# Patient Record
Sex: Female | Born: 1975 | Race: Black or African American | Hispanic: No | Marital: Single | Smoking: Former smoker
Health system: Southern US, Community
[De-identification: ages and names within clinical notes are randomized; demographics above are authoritative.]

## PROBLEM LIST (undated history)

## (undated) DIAGNOSIS — D649 Anemia, unspecified: Secondary | ICD-10-CM

## (undated) DIAGNOSIS — G4730 Sleep apnea, unspecified: Secondary | ICD-10-CM

## (undated) DIAGNOSIS — M1990 Unspecified osteoarthritis, unspecified site: Secondary | ICD-10-CM

## (undated) DIAGNOSIS — I10 Essential (primary) hypertension: Secondary | ICD-10-CM

## (undated) HISTORY — PX: TUBAL LIGATION: SHX77

## (undated) HISTORY — PX: KNEE SURGERY: SHX244

## (undated) HISTORY — PX: TOTAL KNEE ARTHROPLASTY: SHX125

## (undated) HISTORY — PX: LAPAROSCOPIC GASTRIC SLEEVE RESECTION: SHX5895

## (undated) HISTORY — PX: CHOLECYSTECTOMY: SHX55

---

## 1997-11-17 ENCOUNTER — Inpatient Hospital Stay (HOSPITAL_COMMUNITY): Admission: AD | Admit: 1997-11-17 | Discharge: 1997-11-17 | Payer: Self-pay | Admitting: *Deleted

## 1999-10-13 ENCOUNTER — Encounter: Admission: RE | Admit: 1999-10-13 | Discharge: 2000-01-11 | Payer: Self-pay | Admitting: Orthopedic Surgery

## 2004-10-07 ENCOUNTER — Emergency Department (HOSPITAL_COMMUNITY): Admission: EM | Admit: 2004-10-07 | Discharge: 2004-10-08 | Payer: Self-pay | Admitting: Emergency Medicine

## 2005-01-01 ENCOUNTER — Emergency Department (HOSPITAL_COMMUNITY): Admission: EM | Admit: 2005-01-01 | Discharge: 2005-01-01 | Payer: Self-pay | Admitting: Emergency Medicine

## 2006-10-22 ENCOUNTER — Emergency Department (HOSPITAL_COMMUNITY): Admission: EM | Admit: 2006-10-22 | Discharge: 2006-10-22 | Payer: Self-pay | Admitting: Emergency Medicine

## 2006-10-27 ENCOUNTER — Emergency Department (HOSPITAL_COMMUNITY): Admission: EM | Admit: 2006-10-27 | Discharge: 2006-10-27 | Payer: Self-pay | Admitting: Family Medicine

## 2009-05-30 ENCOUNTER — Emergency Department (HOSPITAL_COMMUNITY): Admission: EM | Admit: 2009-05-30 | Discharge: 2009-05-30 | Payer: Self-pay | Admitting: Family Medicine

## 2009-12-22 ENCOUNTER — Inpatient Hospital Stay (HOSPITAL_COMMUNITY): Admission: RE | Admit: 2009-12-22 | Discharge: 2009-12-25 | Payer: Self-pay | Admitting: Orthopedic Surgery

## 2010-01-20 ENCOUNTER — Encounter: Admission: RE | Admit: 2010-01-20 | Discharge: 2010-01-27 | Payer: Self-pay | Admitting: Orthopedic Surgery

## 2010-02-02 ENCOUNTER — Inpatient Hospital Stay (HOSPITAL_COMMUNITY): Admission: RE | Admit: 2010-02-02 | Discharge: 2010-02-05 | Payer: Self-pay | Admitting: Orthopedic Surgery

## 2010-02-24 ENCOUNTER — Encounter: Admission: RE | Admit: 2010-02-24 | Discharge: 2010-03-18 | Payer: Self-pay | Admitting: Orthopedic Surgery

## 2010-12-28 LAB — BASIC METABOLIC PANEL
BUN: 4 mg/dL — ABNORMAL LOW (ref 6–23)
CO2: 26 mEq/L (ref 19–32)
CO2: 29 mEq/L (ref 19–32)
Calcium: 8.1 mg/dL — ABNORMAL LOW (ref 8.4–10.5)
Chloride: 105 mEq/L (ref 96–112)
Chloride: 105 mEq/L (ref 96–112)
Creatinine, Ser: 0.76 mg/dL (ref 0.4–1.2)
GFR calc Af Amer: >60 mL/min (ref >60)
GFR calc Af Amer: >60 mL/min (ref >60)
GFR calc non Af Amer: >60 mL/min (ref >60)
Sodium: 135 mEq/L (ref 135–145)
Sodium: 138 mEq/L (ref 135–145)

## 2010-12-28 LAB — CBC
HCT: 27.6 % — ABNORMAL LOW (ref 36.0–46.0)
HCT: 32.2 % — ABNORMAL LOW (ref 36.0–46.0)
Hemoglobin: 7.7 g/dL — ABNORMAL LOW (ref 12.0–15.0)
Hemoglobin: 8.9 g/dL — ABNORMAL LOW (ref 12.0–15.0)
MCHC: 31.6 g/dL (ref 30.0–36.0)
MCHC: 31.9 g/dL (ref 30.0–36.0)
MCHC: 32.1 g/dL (ref 30.0–36.0)
MCV: 75.6 fL — ABNORMAL LOW (ref 78.0–100.0)
MCV: 76.3 fL — ABNORMAL LOW (ref 78.0–100.0)
Platelets: 271 10*3/uL (ref 150–400)
RBC: 3.67 MIL/uL — ABNORMAL LOW (ref 3.87–5.11)
RDW: 20.7 % — ABNORMAL HIGH (ref 11.5–15.5)
WBC: 12.2 10*3/uL — ABNORMAL HIGH (ref 4.0–10.5)
WBC: 6.1 10*3/uL (ref 4.0–10.5)

## 2010-12-28 LAB — DIFFERENTIAL
Basophils Absolute: 0 10*3/uL (ref 0.0–0.1)
Eosinophils Absolute: 0.1 10*3/uL (ref 0.0–0.7)
Eosinophils Relative: 2 % (ref 0–5)
Lymphs Abs: 2.5 10*3/uL (ref 0.7–4.0)
Neutro Abs: 3.1 10*3/uL (ref 1.7–7.7)

## 2010-12-28 LAB — URINALYSIS, ROUTINE W REFLEX MICROSCOPIC
Hgb urine dipstick: NEGATIVE
Ketones, ur: NEGATIVE mg/dL
Nitrite: NEGATIVE
Protein, ur: NEGATIVE mg/dL
Urobilinogen, UA: 0.2 mg/dL (ref 0.0–1.0)
pH: 5.5 (ref 5.0–8.0)

## 2010-12-28 LAB — APTT: aPTT: 31 seconds (ref 24–37)

## 2010-12-28 LAB — PROTIME-INR
INR: 1.13 (ref 0.00–1.49)
Prothrombin Time: 14.4 seconds (ref 11.6–15.2)

## 2010-12-28 LAB — PREGNANCY, URINE: Preg Test, Ur: NEGATIVE

## 2010-12-28 LAB — TYPE AND SCREEN: Antibody Screen: NEGATIVE

## 2011-01-03 LAB — CBC
HCT: 21.8 % — ABNORMAL LOW (ref 36.0–46.0)
HCT: 26.4 % — ABNORMAL LOW (ref 36.0–46.0)
Hemoglobin: 6.9 g/dL — CL (ref 12.0–15.0)
Hemoglobin: 8.1 g/dL — ABNORMAL LOW (ref 12.0–15.0)
Hemoglobin: 9.6 g/dL — ABNORMAL LOW (ref 12.0–15.0)
MCHC: 30.8 g/dL (ref 30.0–36.0)
MCHC: 31.8 g/dL (ref 30.0–36.0)
MCV: 72.8 fL — ABNORMAL LOW (ref 78.0–100.0)
Platelets: 301 10*3/uL (ref 150–400)
RBC: 3.62 MIL/uL — ABNORMAL LOW (ref 3.87–5.11)
RBC: 4.29 MIL/uL (ref 3.87–5.11)
RDW: 17 % — ABNORMAL HIGH (ref 11.5–15.5)
RDW: 17.4 % — ABNORMAL HIGH (ref 11.5–15.5)
RDW: 17.5 % — ABNORMAL HIGH (ref 11.5–15.5)

## 2011-01-03 LAB — DIFFERENTIAL
Basophils Absolute: 0.1 10*3/uL (ref 0.0–0.1)
Lymphocytes Relative: 32 % (ref 12–46)
Lymphs Abs: 2.4 10*3/uL (ref 0.7–4.0)
Monocytes Absolute: 0.5 10*3/uL (ref 0.1–1.0)
Neutro Abs: 4 10*3/uL (ref 1.7–7.7)
Neutrophils Relative %: 54 % (ref 43–77)

## 2011-01-03 LAB — URINALYSIS, ROUTINE W REFLEX MICROSCOPIC
Bilirubin Urine: NEGATIVE
Ketones, ur: NEGATIVE mg/dL
Protein, ur: NEGATIVE mg/dL
Specific Gravity, Urine: 1.029 (ref 1.005–1.030)
Urobilinogen, UA: 0.2 mg/dL (ref 0.0–1.0)

## 2011-01-03 LAB — TYPE AND SCREEN
ABO/RH(D): AB POS
Antibody Screen: NEGATIVE

## 2011-01-03 LAB — BASIC METABOLIC PANEL
BUN: 14 mg/dL (ref 6–23)
BUN: 9 mg/dL (ref 6–23)
CO2: 28 mEq/L (ref 19–32)
CO2: 28 mEq/L (ref 19–32)
CO2: 28 mEq/L (ref 19–32)
Calcium: 8.7 mg/dL (ref 8.4–10.5)
Chloride: 104 mEq/L (ref 96–112)
Chloride: 108 mEq/L (ref 96–112)
Creatinine, Ser: 0.73 mg/dL (ref 0.4–1.2)
GFR calc Af Amer: >60 mL/min (ref >60)
Glucose, Bld: 98 mg/dL (ref 70–99)
Potassium: 3.3 mEq/L — ABNORMAL LOW (ref 3.5–5.1)
Potassium: 4 mEq/L (ref 3.5–5.1)
Potassium: 4.3 mEq/L (ref 3.5–5.1)
Sodium: 135 mEq/L (ref 135–145)
Sodium: 136 mEq/L (ref 135–145)
Sodium: 140 mEq/L (ref 135–145)

## 2011-01-03 LAB — URINE MICROSCOPIC-ADD ON

## 2011-01-03 LAB — ABO/RH: ABO/RH(D): AB POS

## 2011-01-03 LAB — APTT: aPTT: 33 seconds (ref 24–37)

## 2011-07-18 ENCOUNTER — Inpatient Hospital Stay (INDEPENDENT_AMBULATORY_CARE_PROVIDER_SITE_OTHER)
Admission: RE | Admit: 2011-07-18 | Discharge: 2011-07-18 | Disposition: A | Discharge status: Home / Self Care | Payer: Medicaid Other | Source: Ambulatory Visit | Attending: Family Medicine | Admitting: Family Medicine

## 2011-07-18 DIAGNOSIS — J069 Acute upper respiratory infection, unspecified: Secondary | ICD-10-CM

## 2011-07-18 LAB — POCT I-STAT, CHEM 8
Calcium, Ion: 1.16 mmol/L (ref 1.12–1.32)
Creatinine, Ser: 0.7 mg/dL (ref 0.50–1.10)
Glucose, Bld: 96 mg/dL (ref 70–99)
HCT: 30 % — ABNORMAL LOW (ref 36.0–46.0)
Hemoglobin: 10.2 g/dL — ABNORMAL LOW (ref 12.0–15.0)
Potassium: 3.6 mEq/L (ref 3.5–5.1)

## 2011-07-18 LAB — POCT URINALYSIS DIP (DEVICE)
Glucose, UA: NEGATIVE mg/dL
Hgb urine dipstick: NEGATIVE
Nitrite: NEGATIVE
Specific Gravity, Urine: >=1.03 (ref 1.005–1.030)
Urobilinogen, UA: 1 mg/dL (ref 0.0–1.0)
pH: 5.5 (ref 5.0–8.0)

## 2012-12-04 ENCOUNTER — Encounter (HOSPITAL_COMMUNITY): Payer: Self-pay | Admitting: Cardiology

## 2012-12-04 ENCOUNTER — Emergency Department (HOSPITAL_COMMUNITY)
Admission: EM | Admit: 2012-12-04 | Discharge: 2012-12-04 | Disposition: A | Discharge status: Home / Self Care | Payer: Self-pay | Attending: Emergency Medicine | Admitting: Emergency Medicine

## 2012-12-04 ENCOUNTER — Emergency Department (HOSPITAL_COMMUNITY): Payer: Self-pay

## 2012-12-04 DIAGNOSIS — R52 Pain, unspecified: Secondary | ICD-10-CM | POA: Insufficient documentation

## 2012-12-04 DIAGNOSIS — R197 Diarrhea, unspecified: Secondary | ICD-10-CM | POA: Insufficient documentation

## 2012-12-04 DIAGNOSIS — R0602 Shortness of breath: Secondary | ICD-10-CM | POA: Insufficient documentation

## 2012-12-04 DIAGNOSIS — R6883 Chills (without fever): Secondary | ICD-10-CM | POA: Insufficient documentation

## 2012-12-04 DIAGNOSIS — R42 Dizziness and giddiness: Secondary | ICD-10-CM | POA: Insufficient documentation

## 2012-12-04 DIAGNOSIS — F17200 Nicotine dependence, unspecified, uncomplicated: Secondary | ICD-10-CM | POA: Insufficient documentation

## 2012-12-04 DIAGNOSIS — R110 Nausea: Secondary | ICD-10-CM | POA: Insufficient documentation

## 2012-12-04 MED ORDER — ALBUTEROL SULFATE HFA 108 (90 BASE) MCG/ACT IN AERS
2.0000 | INHALATION_SPRAY | RESPIRATORY_TRACT | Status: DC | PRN
  Administered 2012-12-04: 2 via RESPIRATORY_TRACT
  Filled 2012-12-04: qty 6.7

## 2012-12-04 NOTE — ED Provider Notes (Signed)
History    This chart was scribed for non-physician practitioner working with Ethelda Chick, MD by Gerlean Ren, ED Scribe. This patient was seen in room TR11C/TR11C and the patient's care was started at 5:21 PM.    CSN: [id number]  Arrival date & time 12/04/12  1628   First MD Initiated Contact with Patient 12/04/12 1647      Chief Complaint  Patient presents with  . Nasal Congestion  . Generalized Body Aches  . Nausea     The history is provided by the patient. No language interpreter was used.  Latasha Snyder is a 37 y.o. female who presents to the Emergency Department complaining of 2 days of waxing-and-waning chills with associated nasal congestion, mild otalgia, nausea without emesis, mild dry cough occasionally productive of sputum, mild occasional light-headedness brought on by normal daily exertion, and mild dyspnea brought on by normal daily exertion.  Pt also reports approximately 15 episodes of non-bloody diarrhea over the past 2 days that has been improving today.  Pt denies sore throat, sneezing, fever, chest pain, abdominal pain.  Pt reports sick contacts with her son and brother, both of whom had diarrhea and emesis 2-3 days ago.  No antibiotics taken recently.  Pt denies recent long travels, recent surgeries.  No h/o PE.  No h/o asthma.  Pt has used Nyquil/Dayquil with no improvements.  Pt reports prior diagnosed flu and states current symptoms do not feel as severe as that incidence.  Pt is a current someday smoker.    History reviewed. No pertinent past medical history.  Past Surgical History  Procedure Laterality Date  . Knee surgery      History reviewed. No pertinent family history.  History  Substance Use Topics  . Smoking status: Current Some Day Smoker  . Smokeless tobacco: Not on file  . Alcohol Use: Yes    No OB history provided.   Review of Systems  Constitutional: Positive for chills. Negative for fever.  HENT: Positive for congestion. Negative for  sore throat and sneezing.   Respiratory: Positive for cough and shortness of breath.   Cardiovascular: Negative for chest pain.  Gastrointestinal: Positive for nausea and diarrhea. Negative for vomiting and abdominal pain.  Neurological: Positive for light-headedness.  All other systems reviewed and are negative.    Allergies  Review of patient's allergies indicates no known allergies.  Home Medications  No current outpatient prescriptions on file.  BP 149/100  Pulse 96  Temp(Src) 98.9 F (37.2 C) (Oral)  Resp 20  SpO2 100%  LMP 11/13/2012  Physical Exam  Nursing note and vitals reviewed. Constitutional: She is oriented to person, place, and time. She appears well-developed and well-nourished. No distress.  HENT:  Head: Normocephalic and atraumatic.  Eyes: EOM are normal.  Neck: Neck supple. No tracheal deviation present.  Cardiovascular: Normal rate, regular rhythm and normal heart sounds.   Pulmonary/Chest: Effort normal and breath sounds normal. No respiratory distress. She has no wheezes. She has no rales.  Abdominal: There is no tenderness.  Musculoskeletal: Normal range of motion.  Neurological: She is alert and oriented to person, place, and time.  Skin: Skin is warm and dry.  Psychiatric: She has a normal mood and affect. Her behavior is normal.    ED Course  Procedures (including critical care time) DIAGNOSTIC STUDIES: Oxygen Saturation is 100% on room air, normal by my interpretation.    COORDINATION OF CARE: 5:29 PM- Informed pt that due to normal exam and negative  chest XR symptoms appear viral in nature and that there are no helpful medications that I can prescribe.  Informed pt that although diarrhea and light-headedness is present I have low suspicion that she is hypotensive.  Advised staying hydrated.  Pt understands and agrees.     Dg Chest 2 View  12/04/2012  *RADIOLOGY REPORT*  Clinical Data: Congestion, cough and chills.  CHEST - 2 VIEW   Comparison: 12/16/2009  Findings: No evidence of focal infiltrate, edema or pleural fluid. The heart size is at the upper limits of normal.  A pronounced rightward convex scoliosis of the thoracic spine is again identified.  IMPRESSION: No active disease.   Original Report Authenticated By: Irish Lack, M.D.      1. Flu-like symptoms       MDM    I personally performed the services described in this documentation, which was scribed in my presence. The recorded information has been reviewed and is accurate.         Fayrene Helper, PA-C 12/05/12 1603

## 2012-12-04 NOTE — ED Notes (Signed)
Patient transported to X-ray 

## 2012-12-04 NOTE — ED Notes (Signed)
Pt reports for the past couple of days she has been having body aches and cough with congestion. Denies any fevers, but reports she has felt nauseated. No distress noted. Denies any abd pain.

## 2012-12-07 NOTE — ED Provider Notes (Signed)
Medical screening examination/treatment/procedure(s) were performed by non-physician practitioner and as supervising physician I was immediately available for consultation/collaboration.  Ethelda Chick, MD 12/07/12 [phone]

## 2013-12-03 ENCOUNTER — Emergency Department (HOSPITAL_COMMUNITY)
Admission: EM | Admit: 2013-12-03 | Discharge: 2013-12-03 | Disposition: A | Discharge status: Home / Self Care | Payer: Medicaid Other | Attending: Emergency Medicine | Admitting: Emergency Medicine

## 2013-12-03 ENCOUNTER — Emergency Department (HOSPITAL_COMMUNITY): Payer: Medicaid Other

## 2013-12-03 ENCOUNTER — Encounter (HOSPITAL_COMMUNITY): Payer: Self-pay | Admitting: Emergency Medicine

## 2013-12-03 DIAGNOSIS — S4980XA Other specified injuries of shoulder and upper arm, unspecified arm, initial encounter: Secondary | ICD-10-CM | POA: Insufficient documentation

## 2013-12-03 DIAGNOSIS — R059 Cough, unspecified: Secondary | ICD-10-CM | POA: Insufficient documentation

## 2013-12-03 DIAGNOSIS — Y9389 Activity, other specified: Secondary | ICD-10-CM | POA: Insufficient documentation

## 2013-12-03 DIAGNOSIS — R05 Cough: Secondary | ICD-10-CM | POA: Insufficient documentation

## 2013-12-03 DIAGNOSIS — S199XXA Unspecified injury of neck, initial encounter: Secondary | ICD-10-CM

## 2013-12-03 DIAGNOSIS — R6883 Chills (without fever): Secondary | ICD-10-CM | POA: Insufficient documentation

## 2013-12-03 DIAGNOSIS — S0993XA Unspecified injury of face, initial encounter: Secondary | ICD-10-CM | POA: Insufficient documentation

## 2013-12-03 DIAGNOSIS — J069 Acute upper respiratory infection, unspecified: Secondary | ICD-10-CM

## 2013-12-03 DIAGNOSIS — J029 Acute pharyngitis, unspecified: Secondary | ICD-10-CM | POA: Insufficient documentation

## 2013-12-03 DIAGNOSIS — S0990XA Unspecified injury of head, initial encounter: Secondary | ICD-10-CM | POA: Insufficient documentation

## 2013-12-03 DIAGNOSIS — Y92410 Unspecified street and highway as the place of occurrence of the external cause: Secondary | ICD-10-CM | POA: Insufficient documentation

## 2013-12-03 DIAGNOSIS — J3489 Other specified disorders of nose and nasal sinuses: Secondary | ICD-10-CM | POA: Insufficient documentation

## 2013-12-03 DIAGNOSIS — F17200 Nicotine dependence, unspecified, uncomplicated: Secondary | ICD-10-CM | POA: Insufficient documentation

## 2013-12-03 DIAGNOSIS — Z79899 Other long term (current) drug therapy: Secondary | ICD-10-CM | POA: Insufficient documentation

## 2013-12-03 DIAGNOSIS — S46909A Unspecified injury of unspecified muscle, fascia and tendon at shoulder and upper arm level, unspecified arm, initial encounter: Secondary | ICD-10-CM | POA: Insufficient documentation

## 2013-12-03 MED ORDER — IBUPROFEN 400 MG PO TABS
800.0000 mg | ORAL_TABLET | Freq: Once | ORAL | Status: AC
  Administered 2013-12-03: 800 mg via ORAL
  Filled 2013-12-03: qty 2
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
## 2013-12-04 NOTE — ED Provider Notes (Signed)
Medical screening examination/treatment/procedure(s) were performed by non-physician practitioner and as supervising physician I was immediately available for consultation/collaboration.  EKG Interpretation   None         Richardean Canalavid H Lyzette Reinhardt, MD 12/04/13 [phone]

## 2014-10-10 ENCOUNTER — Encounter (HOSPITAL_COMMUNITY): Payer: Self-pay | Admitting: Emergency Medicine

## 2014-10-10 ENCOUNTER — Emergency Department (HOSPITAL_COMMUNITY)
Admission: EM | Admit: 2014-10-10 | Discharge: 2014-10-10 | Disposition: A | Discharge status: Home / Self Care | Payer: No Typology Code available for payment source | Attending: Emergency Medicine | Admitting: Emergency Medicine

## 2014-10-10 DIAGNOSIS — R000 Tachycardia, unspecified: Secondary | ICD-10-CM | POA: Insufficient documentation

## 2014-10-10 DIAGNOSIS — R1110 Vomiting, unspecified: Secondary | ICD-10-CM | POA: Insufficient documentation

## 2014-10-10 DIAGNOSIS — Z720 Tobacco use: Secondary | ICD-10-CM | POA: Insufficient documentation

## 2014-10-10 DIAGNOSIS — J40 Bronchitis, not specified as acute or chronic: Secondary | ICD-10-CM | POA: Insufficient documentation

## 2014-10-10 HISTORY — DX: Morbid (severe) obesity due to excess calories: E66.01

## 2014-10-10 MED ORDER — IPRATROPIUM BROMIDE 0.02 % IN SOLN
0.5000 mg | Freq: Once | RESPIRATORY_TRACT | Status: AC
  Administered 2014-10-10: 0.5 mg via RESPIRATORY_TRACT
  Filled 2014-10-10: qty 2.5

## 2014-10-10 MED ORDER — ALBUTEROL SULFATE (2.5 MG/3ML) 0.083% IN NEBU
5.0000 mg | INHALATION_SOLUTION | Freq: Once | RESPIRATORY_TRACT | Status: AC
  Administered 2014-10-10: 5 mg via RESPIRATORY_TRACT
  Filled 2014-10-10: qty 6

## 2014-10-10 MED ORDER — ALBUTEROL SULFATE HFA 108 (90 BASE) MCG/ACT IN AERS
2.0000 | INHALATION_SPRAY | RESPIRATORY_TRACT | Status: DC | PRN
  Administered 2014-10-10: 2 via RESPIRATORY_TRACT
  Filled 2014-10-10: qty 6.7

## 2014-10-10 MED ORDER — GUAIFENESIN 100 MG/5ML PO LIQD: 100.0000 mg | ORAL | Status: DC | PRN

## 2014-10-10 NOTE — ED Provider Notes (Signed)
CSN: [id number]     Arrival date & time 10/10/14  1231 History  This chart was scribed for Fayrene Helper, PA-C, working with Audree Camel, MD by Elon Spanner, ED Scribe. This patient was seen in room WTR8/WTR8 and the patient's care was started at 1:45 PM.    Chief Complaint  Patient presents with  . Cough  . Nasal Congestion   The history is provided by the patient. No language interpreter was used.   HPI Comments: Latasha Snyder is a 39 y.o. female who presents to the Emergency Department complaining of a cough onset 3 days ago.  She reports that she has not been feeling well for 2 weeks but observed worsening 3 days ago with onset of associated chest/head congestion, SOB, headache, chills, rhinorrhea, sneezing, coughing, sore throat, and posttussive emesis.  Patient reports using some OTC medication without relief including theraflu.  Patient denies history of asthma. Patient denies history of DVT/PE.  Patient denies recent prolonged immobilization.  Patient denies use ofanti-coagulants.  Patient denies history of cancer. Patient denies abominal cramping, nausea, diarrhea    Past Medical History  Diagnosis Date  . Morbid obesity    Past Surgical History  Procedure Laterality Date  . Knee surgery     No family history on file. History  Substance Use Topics  . Smoking status: Current Some Day Smoker  . Smokeless tobacco: Not on file  . Alcohol Use: Yes   OB History    No data available     Review of Systems  Constitutional: Positive for chills.  HENT: Positive for congestion, rhinorrhea and sore throat.   Respiratory: Positive for cough.   Gastrointestinal: Positive for vomiting. Negative for nausea, abdominal pain and diarrhea.  Neurological: Positive for headaches.      Allergies  Review of patient's allergies indicates no known allergies.  Home Medications   Prior to Admission medications   Medication Sig Start Date End Date Taking? Authorizing Provider   Chlorphen-Pseudoephed-APAP (THERAFLU FLU/COLD PO) Take 10 mLs by mouth daily as needed. For cold symptoms    Historical Provider, MD   BP 153/79 mmHg  Pulse 109  Temp(Src) 98 F (36.7 C) (Oral)  Resp 20  SpO2 98% Physical Exam  Constitutional: She is oriented to person, place, and time. She appears well-developed and well-nourished. No distress.  HENT:  Head: Normocephalic and atraumatic.  Ears normal.  No tonsillar enlargement, no exudate, no trismus.  No sinus tenderness.    Eyes: Conjunctivae and EOM are normal.  Neck: Neck supple. No tracheal deviation present.  Cardiovascular: Normal rate.   Tachycardia without murmurs rubs or gallops.  Pulmonary/Chest: Effort normal. No respiratory distress.  Mild expiratory wheezes heard throughout.   Musculoskeletal: Normal range of motion.  Neurological: She is alert and oriented to person, place, and time.  Skin: Skin is warm and dry.  Psychiatric: She has a normal mood and affect. Her behavior is normal.  Nursing note and vitals reviewed.   ED Course  Procedures (including critical care time)  DIAGNOSTIC STUDIES: Oxygen Saturation is 98% on RA, normal by my interpretation.    COORDINATION OF CARE:  1:49 PM Pt here with URI sxs.  She does not have hx of asthma/copd however she is actively wheezing.  She is also tachycardic but does not have any significant risk factors for PE.  Will order breathing treatment.  Patient acknowledges and agrees with plan.    Labs Review Labs Reviewed - No data to display  Imaging Review No results found.   EKG Interpretation None      MDM   Final diagnoses:  Bronchitis    BP 153/79 mmHg  Pulse 109  Temp(Src) 98 F (36.7 C) (Oral)  Resp 20  SpO2 98%   I personally performed the services described in this documentation, which was scribed in my presence. The recorded information has been reviewed and is accurate.       Fayrene Helper, PA-C 10/10/14 1507  Audree Camel,  MD 10/10/14 [phone]

## 2014-10-10 NOTE — ED Notes (Signed)
Pt c/o cough, nasal congestion, and subjective fever (afebrile here) x 3 wks.

## 2014-10-10 NOTE — ED Notes (Signed)
Pt ambulated; tolerated procedure well; pt states she is jittery

## 2014-10-10 NOTE — Discharge Instructions (Signed)

## 2015-08-05 ENCOUNTER — Emergency Department (HOSPITAL_COMMUNITY)
Admission: EM | Admit: 2015-08-05 | Discharge: 2015-08-05 | Disposition: A | Discharge status: Home / Self Care | Payer: Medicaid Other | Attending: Emergency Medicine | Admitting: Emergency Medicine

## 2015-08-05 ENCOUNTER — Encounter (HOSPITAL_COMMUNITY): Payer: Self-pay

## 2015-08-05 DIAGNOSIS — R631 Polydipsia: Secondary | ICD-10-CM | POA: Diagnosis not present

## 2015-08-05 DIAGNOSIS — R358 Other polyuria: Secondary | ICD-10-CM | POA: Insufficient documentation

## 2015-08-05 DIAGNOSIS — N3000 Acute cystitis without hematuria: Secondary | ICD-10-CM | POA: Insufficient documentation

## 2015-08-05 DIAGNOSIS — Z3202 Encounter for pregnancy test, result negative: Secondary | ICD-10-CM | POA: Diagnosis not present

## 2015-08-05 DIAGNOSIS — R51 Headache: Secondary | ICD-10-CM | POA: Insufficient documentation

## 2015-08-05 DIAGNOSIS — R05 Cough: Secondary | ICD-10-CM | POA: Insufficient documentation

## 2015-08-05 DIAGNOSIS — Z87891 Personal history of nicotine dependence: Secondary | ICD-10-CM | POA: Diagnosis not present

## 2015-08-05 DIAGNOSIS — R6883 Chills (without fever): Secondary | ICD-10-CM | POA: Diagnosis not present

## 2015-08-05 DIAGNOSIS — R1030 Lower abdominal pain, unspecified: Secondary | ICD-10-CM | POA: Diagnosis present

## 2015-08-05 LAB — URINALYSIS, ROUTINE W REFLEX MICROSCOPIC
BILIRUBIN URINE: NEGATIVE
Glucose, UA: NEGATIVE mg/dL
Ketones, ur: NEGATIVE mg/dL
NITRITE: NEGATIVE
PROTEIN: 100 mg/dL — AB
SPECIFIC GRAVITY, URINE: 1.012 (ref 1.005–1.030)
UROBILINOGEN UA: 0.2 mg/dL (ref 0.0–1.0)
pH: 5.5 (ref 5.0–8.0)

## 2015-08-05 LAB — URINE MICROSCOPIC-ADD ON

## 2015-08-05 LAB — PREGNANCY, URINE: PREG TEST UR: NEGATIVE

## 2015-08-05 LAB — CBG MONITORING, ED: GLUCOSE-CAPILLARY: 93 mg/dL (ref 65–99)

## 2015-08-05 MED ORDER — CEPHALEXIN 500 MG PO CAPS: 500.0000 mg | ORAL_CAPSULE | Freq: Four times a day (QID) | ORAL | Status: DC

## 2015-08-05 NOTE — ED Notes (Signed)
Pt c/o RLQ pain, odorous urine, chills, generalized body aches, and headache x 3 days.  Pain score 7/10.  Pt reports taking OTC medications w/ some relief.  Denies n/v/d.  Denies vaginal discharge.

## 2015-08-05 NOTE — Discharge Instructions (Signed)
It is important to find a primary care doctor! I have attached resources to help you find a doctor in the area. I would like you to follow up with this doctor to ensure you have cleared the infection.  Please take antibiotic as directed.  Return if fever, increase in pain, or any additional concern.   Emergency Department Resource Guide 1) Find a Doctor and Pay Out of Pocket Although you won't have to find out who is covered by your insurance plan, it is a good idea to ask around and get recommendations. You will then need to call the office and see if the doctor you have chosen will accept you as a new patient and what types of options they offer for patients who are self-pay. Some doctors offer discounts or will set up payment plans for their patients who do not have insurance, but you will need to ask so you aren't surprised when you get to your appointment.  2) Contact Your Local Health Department Not all health departments have doctors that can see patients for sick visits, but many do, so it is worth a call to see if yours does. If you don't know where your local health department is, you can check in your phone book. The CDC also has a tool to help you locate your state's health department, and many state websites also have listings of all of their local health departments.  3) Find a Walk-in Clinic If your illness is not likely to be very severe or complicated, you may want to try a walk in clinic. These are popping up all over the country in pharmacies, drugstores, and shopping centers. They're usually staffed by nurse practitioners or physician assistants that have been trained to treat common illnesses and complaints. They're usually fairly quick and inexpensive. However, if you have serious medical issues or chronic medical problems, these are probably not your best option.  No Primary Care Doctor: - Call Health Connect at  [phone] - they can help you locate a primary care doctor that   accepts your insurance, provides certain services, etc. - Physician Referral Service- [phone]  Chronic Pain Problems: Organization         Address  Phone   Notes  [facility]  [phone] Patients need to be referred by their primary care doctor.   Medication Assistance: Organization         Address  Phone   Notes  [facility] Medication [facility] [address] [place] [place], [place] [address] [phone] --Must be a resident of [facility] -- Must have NO insurance coverage whatsoever (no Medicaid/ Medicare, etc.) -- The pt. MUST have a primary care doctor that directs their care regularly and follows them in the community   MedAssist  [phone]   [employer]  [phone]    Agencies that provide inexpensive medical care: Organization         Address  Phone   Notes  [facility]  [phone]   [facility]    [phone]   [facility] [address] [place], [place] [address] [phone]   [facility] [address] [place] [place] [phone]   Planned Parenthood    [phone]   [facility]    [phone]   [facility]  [address], [place] Phone:  [phone], Fax:  [phone])  [phone] Hours of Operation:  9 am - 6 pm, M-F.  Also accepts Medicaid/Medicare and self-pay.  [facility]  [address], [place] Phone: [phone], Fax: [phone]. Hours of Operation:  8:30 am - 5:30 pm, M-F.  Also accepts Medicaid and self-pay.  [facility] [place] [address], [place] Phone: [phone]   [facility] [address] Natasha Bence [place], [place] [phone], Ext. [phone] Mondays & Thursdays: 7-9 AM.  First 15 patients are seen on a first come, first serve basis.    Medicaid-accepting [facility] Providers:  Organization          Address  Phone   Notes  [facility] [address], [place] [phone] Also accepts self-pay patients.  [facility] [address] Laurell Josephs [place], [place]  [phone]   [facility] [address], [place] [phone]   [facility] [address] [place] [phone]   Renaye Rakers [address], [place]   [phone] Only accepts [place] Access [place] patients after they have their name applied to their card.   Self-Pay (no insurance) in [facility]:  Organization         Address  Phone   Notes  Sickle Cell Patients, [facility] [address] [place], [place] [phone]   [facility] [address] [place], [place] [phone]   [facility] [place]  [address], [place] [phone]   [facility]/Dr. Osei-Bonsu  [address] [place] or [address], [place] [phone] Phone number for both [place] and [place] locations is the same.  [facility] [address] [place] [phone]   [facility] [address] [place] or [address] [phone]   [facility] [address], [place] [phone], phone; [phone], fax Sees patients 1st and 3rd Saturday of every month.  Must not qualify for public or private insurance (i.e. Medicaid, Medicare, [place] Health Choice, Veterans' Benefits)  Household income should be no more than 200% of the poverty level The clinic cannot treat you if you are pregnant or think you are pregnant  Sexually transmitted diseases are not treated at the clinic.    Dental Care: Organization         Address  Phone  Notes  [facility] Department of [facility] [address] [place],  [place] [phone] Accepts children up to age 65 who are enrolled in [place] or [place] Health Choice; pregnant women with a Medicaid card; and children who have applied for Medicaid or [place] Health Choice, but were declined, whose parents can pay a reduced fee at time of service.  [facility] Department of [facility]  [address], [place] [phone] Accepts children up to age 3 who are enrolled in [place] or [place] Health Choice; pregnant women with a Medicaid card; and children who have applied for Medicaid or [place] Health Choice, but were declined, whose parents can pay a reduced fee at time of service.  [facility]  [address] [place], [place] [phone] Patients are seen by appointment only. Walk-ins are not accepted. [facility]  will see patients 37 years of age and older. Monday - Tuesday (8am-5pm) Most Wednesdays (8:30-5pm) $30 per visit, cash only  [facility]  [address], [facility] [phone] Patients are seen by appointment only. Walk-ins are not accepted. [facility] will see patients 37 years of age and older. One Wednesday Evening (Monthly: Volunteer Based).  $30 per visit, cash only  [employer] [facility]  [phone]) [phone] for adults; Children under age 724, call Graduate Pediatric Dentistry at [phone]. Children aged 654-14, please call [phone]) [phone] to request a pediatric application.  Dental services are provided in all areas of dental care including fillings, crowns and bridges, complete and partial dentures, implants, gum treatment, root canals, and extractions. Preventive care is also provided. Treatment is provided to both adults and children. Patients are selected via a lottery and there is often a waiting list.   [facility] [address], [place]  [phone] [email or web address]   [facility] [address], [place], [place]  [phone], Ext. [phone] Second and Fourth Thursday of each month, opens at 6:30 AM; Clinic ends at 9 AM.  Patients are seen on a first-come first-served basis, and a limited number are seen during each clinic.   [facility]  [address], [place], [place] [phone]   Eligibility Requirements You must have lived in [place], [place], or [place] counties for at least the last three months.   You cannot be eligible for state or federal sponsored [place] Cityhealthcare insurance, including CIGNAVeterans Administration, [place]icaid, or [employer]icare.   You generally cannot be eligible for healthcare insurance through your employer.    How to apply: Eligibility screenings are held every Tuesday and Wednesday afternoon from 1:00 pm until 4:00 pm. You do not need an appointment for the interview!  [facility] [address], [place], [place] [phone]   [facility]  [phone]   [facility]  [phone]   [facility]  [phone]    Behavioral Health Resources in the Community: Intensive Outpatient Programs Organization         Address  Phone  Notes  [facility] [address], [place], [place] [phone]   [facility] [address], [place], [place] [phone]   [facility] [address], [place], [place]  [phone]   [facility] [address],  [place], [place] [phone] or [phone]   Substance Abuse Resources Organization         Address  Phone  Notes  [facility]  [phone]   [facility]  [phone]   [facility]  [phone]   [facility]  [phone]   Residential & Outpatient Substance Abuse Program  [phone]   Psychological Services Organization         Address  Phone  Notes  [facility]  [phone]   [facility]  [phone]- [phone]    [facility] [address], [place] [phone] or [phone]    Mobile Crisis Teams Organization         Address  Phone  Notes  [facility], Mobile Crisis Care Unit  [phone]   [facility]  [address] [place], [place] [phone]   [facility] [address] [employer] [facility] [place] [phone]    Self-Help/Support Groups Organization  Address  Phone             Notes  [facility] [place] - variety of support groups  [phone]- [phone] Call for more information  Narcotics Anonymous (NA), [facility] [address], [employer] [place]  2 meetings at this location   Statistician         Address  Phone  Notes  [facility] [address],    [place]  [phone]   [facility]  [address] [place] [address], [place], [place] [phone]   [facility] [address] [place], [place] [phone] Admissions: 8am-3pm M-F  [facility] [address]    [place], [place] [phone]   [facility] [address] [place], [place], [place] [phone]   [facility] [address]  [place], [place] [phone]   [facility] - Intensive Outpatient [address] Laurell Josephs [address], [place], [place] [phone]   [facility] ([facility] [address] [place].,  [place], [place] [phone] or [phone]   [facility] ([facility]) [address], [place], [place] [phone] Accepts Medicaid  [facility] [address]  [place] [phone] Substance Abuse/Addiction Treatment   [facility] Organization         Address  Phone  Notes  [facility]  [phone]   Angie Fava, PhD [address] Ervin Knack [place], [place]   [phone] or [phone]   [facility]   [address] [place], [place] [phone]   [facility] [address] [place], [place] [phone] Insurance/Medicaid/sponsorship through [facility] and Families [address]                                    [place], [place] [phone] Therapy/tele-psych/case  [facility] [address], [place] [phone]    Dr. Lolly Mustache  [phone]   [facility] [place]  [employer] [facility] Dept. 1) [address], [place] 2) [address], [place] 3)  [address], [place] [phone]  [phone]   [facility] Child Abuse Hotline 5757373218 or 5182515725 (After Hours)        Urinary Tract Infection A urinary tract infection (UTI) can occur any place along the urinary tract. The tract includes the kidneys, ureters, bladder, and urethra. A type of germ called bacteria often causes a UTI. UTIs are often helped with antibiotic medicine.  HOME CARE   If given, take antibiotics as told by your doctor. Finish them even if you start to feel better.  Drink enough fluids to keep your pee (urine) clear or pale yellow.  Avoid tea, drinks with caffeine, and bubbly (carbonated) drinks.  Pee often. Avoid holding your pee in for a long time.  Pee before and after having sex (intercourse).  Wipe from front to back after you poop (bowel movement) if you are a woman. Use each tissue only once. GET HELP RIGHT AWAY IF:   You have back pain.  You have lower belly (abdominal) pain.  You have chills.  You feel sick to your stomach (nauseous).  You throw up (vomit).  Your burning or discomfort with peeing does not go away.  You  have a fever.  Your symptoms are not better in 3 days. MAKE SURE YOU:   Understand these instructions.  Will watch your condition.  Will get help right away if you are not doing well or get worse.   This information is not intended to replace advice given  to you by your health care provider. Make sure you discuss any questions you have with your health care provider.   Document Released: 03/14/2008 Document Revised: 10/17/2014 Document Reviewed: 04/26/2012 Elsevier Interactive Patient Education [employer]2016 Elsevier Inc.

## 2015-08-05 NOTE — ED Provider Notes (Signed)
CSN: [id number]     Arrival date & time 08/05/15  0957 History   First MD Initiated Contact with Patient 08/05/15 1017     Chief Complaint  Patient presents with  . Abdominal Pain  . Chills  . Generalized Body Aches     (Consider location/radiation/quality/duration/timing/severity/associated sxs/prior Treatment) Patient is a 39 y.o. female presenting with abdominal pain. The history is provided by the patient and medical records. No language interpreter was used.  Abdominal Pain Associated symptoms: chills and cough   Associated symptoms: no constipation, no diarrhea, no dysuria, no fatigue, no fever, no hematuria, no nausea, no shortness of breath, no sore throat, no vaginal bleeding, no vaginal discharge and no vomiting    Latasha Snyder is a 39 y.o. female  who presents to the Emergency Department complaining of constant lower abdominal pain x 3 days which has been gradually worsening. Bilateral pain, but R > L. Pain is described as sharp 7/10 now. Gets mild relief with ibuprofen. Pain radiates to lower back - this pain is described as "vague, achy" pain. No aggravating factors - does not worsen with food or movement. LMP approx. first week of October. Patient admits to increase in urinary frequency but no dysuria or urgency. Admits that urine has been cloudy over the past few days.    Past Medical History  Diagnosis Date  . Morbid obesity [facility])    Past Surgical History  Procedure Laterality Date  . Knee surgery    . Total knee arthroplasty Bilateral    History reviewed. No pertinent family history. Social History  Substance Use Topics  . Smoking status: Former Games developer  . Smokeless tobacco: None  . Alcohol Use: Yes   OB History    No data available     Review of Systems  Constitutional: Positive for chills. Negative for fever, diaphoresis, activity change, appetite change, fatigue and unexpected weight change.  HENT: Negative for congestion, hearing loss, rhinorrhea and  sore throat.   Eyes: Negative for visual disturbance.  Respiratory: Positive for cough. Negative for shortness of breath and wheezing.        Non-productive cough for ~ 1 week.   Cardiovascular: Negative.   Gastrointestinal: Positive for abdominal pain. Negative for nausea, vomiting, diarrhea, constipation and blood in stool.  Endocrine: Positive for polydipsia and polyuria.  Genitourinary: Positive for frequency. Negative for dysuria, hematuria, decreased urine volume, vaginal bleeding, vaginal discharge and difficulty urinating.  Musculoskeletal: Positive for back pain. Negative for myalgias, joint swelling, arthralgias and neck pain.  Skin: Negative for rash.  Neurological: Positive for headaches. Negative for dizziness and weakness.      Allergies  Review of patient's allergies indicates no known allergies.  Home Medications   Prior to Admission medications   Medication Sig Start Date End Date Taking? Authorizing Provider  Chlorphen-Pseudoephed-APAP (THERAFLU FLU/COLD PO) Take 10 mLs by mouth daily as needed. For cold symptoms    Historical Provider, MD  guaiFENesin (ROBITUSSIN) 100 MG/5ML liquid Take 5-10 mLs (100-200 mg total) by mouth every 4 (four) hours as needed for cough. 10/10/14   Fayrene Helper, PA-C   BP 163/94 mmHg  Pulse 94  Temp(Src) 97.9 F (36.6 C) (Oral)  Resp 14  SpO2 97%  LMP 07/15/2015 Physical Exam  Constitutional: She is oriented to person, place, and time. She appears well-developed and well-nourished. No distress.  Obese AAF in NAD  HENT:  Head: Normocephalic and atraumatic.  Eyes: No scleral icterus.  Neck: Normal range of motion.  Cardiovascular: Normal rate, regular rhythm, normal heart sounds and intact distal pulses.  Exam reveals no gallop and no friction rub.   No murmur heard. Pulmonary/Chest: Effort normal and breath sounds normal. No respiratory distress. She has no wheezes. She has no rales. She exhibits no tenderness.  Abdominal: Soft. Bowel  sounds are normal. She exhibits no distension and no mass. There is tenderness (TTP of RLQ). There is no rebound and no guarding.  Musculoskeletal: Normal range of motion. She exhibits no edema.  No CVA tenderness  Lymphadenopathy:    She has no cervical adenopathy.  Neurological: She is alert and oriented to person, place, and time.  Skin: Skin is warm and dry. No rash noted. She is not diaphoretic. No erythema. No pallor.  Psychiatric: She has a normal mood and affect. Her behavior is normal. Judgment and thought content normal.  Nursing note and vitals reviewed.   ED Course  Procedures (including critical care time) Labs Review Labs Reviewed  URINE CULTURE  URINALYSIS, ROUTINE W REFLEX MICROSCOPIC (NOT AT [facility])  PREGNANCY, URINE    Imaging Review No results found. I have personally reviewed and evaluated these images and lab results as part of my medical decision-making.   EKG Interpretation None      MDM   Final diagnoses:  None   Latasha Pokeerra N Snyder presents with lower abdominal pain x3 days. Associated symptoms include urine color changes, dysuria and increased frequency. UA returned with large amounts of leuks and many bacteria. Upreg negative. No recent unprotected sex or new partners. Pt. Will be discharged home with Keflex for UTI with instructions on follow up. No CVA tenderness or signs of pyelo.  Blood glucose ordered because of increased thirst and urination.   Chase PicketJaime Pilcher Ward, PA-C     [facility] Col[facility]ime Pilcher Ward, [place]-C 08/05/15 1351  Eber HongBrian Miller, MD 08/06/15 [phone]

## 2015-08-05 NOTE — ED Provider Notes (Signed)
The patient is a 39 year old female, she has a history of several days of worsening lower pelvic pain. She has subjective fevers and chills however today she feels better with regards to fevers. She does have ongoing pain in the lower and right lower area with some hesitancy with urination. On exam she has minimal tenderness across the lower abdomen, it is not focal. Urinalysis reviewed, shows urinary tract infection, patient updated on condition, will be prescribed medications, stable for discharge.  Medical screening examination/treatment/procedure(s) were conducted as a shared visit with non-physician practitioner(s) and myself.  I personally evaluated the patient during the encounter.  Clinical Impression:   Final diagnoses:  Acute cystitis without hematuria         Eber HongBrian Nahiem Dredge, MD 08/06/15 1524

## 2015-08-07 LAB — URINE CULTURE: Culture: >=100000

## 2015-08-08 ENCOUNTER — Telehealth (HOSPITAL_BASED_OUTPATIENT_CLINIC_OR_DEPARTMENT_OTHER): Payer: Self-pay | Admitting: Emergency Medicine

## 2015-08-08 NOTE — Telephone Encounter (Signed)
Post ED Visit - Positive Culture Follow-up  Culture report reviewed by antimicrobial stewardship pharmacist:  []  Celedonio MiyamotoJeremy Frens, Pharm.D., BCPS []  Georgina PillionElizabeth Martin, [address]D., BCPS []  MarionMinh Pham, [address]Ph[address]D., BCPS, AAHIVP []  Estella HuskMichelle Turner, Pharm.D., BCPS, AAHIVP []  Cristy Reyes, [address]D. []  Tennis Mustassie Stewart, Pharm.D. Garvin FilaMike Maccia PharmD  Positive urine culture E. coli Treated with cephalexin, organism sensitive to the same and no further patient follow-up is required at this time.  Berle MullMiller, Dorothia Passmore 08/08/2015, 10:05 AM

## 2016-05-04 ENCOUNTER — Ambulatory Visit (HOSPITAL_COMMUNITY)
Admission: EM | Admit: 2016-05-04 | Discharge: 2016-05-04 | Disposition: A | Discharge status: Home / Self Care | Payer: 59 | Attending: Family Medicine | Admitting: Family Medicine

## 2016-05-04 ENCOUNTER — Encounter (HOSPITAL_COMMUNITY): Payer: Self-pay | Admitting: Emergency Medicine

## 2016-05-04 DIAGNOSIS — N390 Urinary tract infection, site not specified: Secondary | ICD-10-CM | POA: Diagnosis not present

## 2016-05-04 LAB — POCT URINALYSIS DIP (DEVICE)
BILIRUBIN URINE: NEGATIVE
Glucose, UA: NEGATIVE mg/dL
Ketones, ur: NEGATIVE mg/dL
NITRITE: POSITIVE — AB
Protein, ur: NEGATIVE mg/dL
Specific Gravity, Urine: 1.015 (ref 1.005–1.030)
Urobilinogen, UA: 0.2 mg/dL (ref 0.0–1.0)
pH: 6 (ref 5.0–8.0)

## 2016-05-04 MED ORDER — PHENAZOPYRIDINE HCL 200 MG PO TABS: 200.0000 mg | ORAL_TABLET | Freq: Three times a day (TID) | ORAL | 0 refills | Status: DC

## 2016-05-04 MED ORDER — CIPROFLOXACIN HCL 500 MG PO TABS: 500.0000 mg | ORAL_TABLET | Freq: Two times a day (BID) | ORAL | 0 refills | Status: DC

## 2016-05-04 NOTE — ED Provider Notes (Signed)
CSN: [id number]     Arrival date & time 05/04/16  1000 History   None    Chief Complaint  Patient presents with  . Headache   (Consider location/radiation/quality/duration/timing/severity/associated sxs/prior Treatment) Patient c/o lower abdominal pain, headache, fever, and mild dysuria.   The history is provided by the patient.  Headache  Pain location:  Generalized Quality:  Dull Radiates to:  Does not radiate Severity currently:  3/10 Onset quality:  Gradual Duration:  1 hour Timing:  Constant Chronicity:  New Similar to prior headaches: no   Context: activity   Relieved by:  Nothing Worsened by:  Nothing Ineffective treatments:  None tried   Past Medical History:  Diagnosis Date  . Morbid obesity (HCC)    Past Surgical History:  Procedure Laterality Date  . KNEE SURGERY    . TOTAL KNEE ARTHROPLASTY Bilateral    History reviewed. No pertinent family history. Social History  Substance Use Topics  . Smoking status: Former Games developer  . Smokeless tobacco: Not on file  . Alcohol use Yes   OB History    No data available     Review of Systems  Constitutional: Negative.   Eyes: Negative.   Respiratory: Negative.   Cardiovascular: Negative.   Gastrointestinal: Negative.   Endocrine: Negative.   Genitourinary: Negative.   Musculoskeletal: Negative.   Skin: Negative.   Allergic/Immunologic: Negative.   Neurological: Positive for headaches.  Hematological: Negative.   Psychiatric/Behavioral: Negative.     Allergies  Review of patient's allergies indicates no known allergies.  Home Medications   Prior to Admission medications   Medication Sig Start Date End Date Taking? Authorizing Provider  ibuprofen (ADVIL,MOTRIN) 200 MG tablet Take 800 mg by mouth every 6 (six) hours as needed for headache or moderate pain.   Yes Historical Provider, MD  cephALEXin (KEFLEX) 500 MG capsule Take 1 capsule (500 mg total) by mouth 4 (four) times daily. 08/05/15   Jaime  Pilcher Ward, PA-C  Pseudoephedrine-APAP-DM (DAYQUIL PO) Take 2 tablets by mouth 3 (three) times daily as needed (cold symptoms).    Historical Provider, MD   Meds Ordered and Administered this Visit  Medications - No data to display  BP 139/83 (BP Location: Left Arm)   Pulse 88   Temp 98.6 F (37 C) (Oral)   Resp 16   LMP 04/11/2016 (Exact Date)   SpO2 100%  No data found.   Physical Exam  Constitutional: She appears well-developed and well-nourished.  HENT:  Head: Normocephalic and atraumatic.  Eyes: EOM are normal. Pupils are equal, round, and reactive to light.  Neck: Normal range of motion. Neck supple.  Cardiovascular: Normal rate and regular rhythm.   Pulmonary/Chest: Effort normal and breath sounds normal.  Abdominal: Soft. Bowel sounds are normal. There is tenderness.  Tender lower abdomen  Musculoskeletal: Normal range of motion.    Urgent Care Course   Clinical Course    Procedures (including critical care time)  Labs Review Labs Reviewed  POCT URINALYSIS DIP (DEVICE) - Abnormal; Notable for the following:       Result Value   Hgb urine dipstick TRACE (*)    Nitrite POSITIVE (*)    Leukocytes, UA SMALL (*)    All other components within normal limits    Imaging Review No results found.   Visual Acuity Review  Right Eye Distance:   Left Eye Distance:   Bilateral Distance:    Right Eye Near:   Left Eye Near:    Bilateral Near:  MDM  UTI Dysuria Headache  Cipro  one po bid x 10 days #20 Pyridium  one po tid x 2 days #6 Tylenol and motrin otc as directed       Deatra Canter, FNP 05/04/16 1234    Deatra Canter, FNP 05/04/16 1235

## 2016-05-04 NOTE — ED Triage Notes (Signed)
The patient presented to the [facility] with a complaint of a headache with chills, and general body aches x 1 day.

## 2016-07-02 ENCOUNTER — Encounter (HOSPITAL_COMMUNITY): Payer: Self-pay

## 2016-07-02 ENCOUNTER — Emergency Department (HOSPITAL_COMMUNITY): Payer: 59

## 2016-07-02 ENCOUNTER — Emergency Department (HOSPITAL_BASED_OUTPATIENT_CLINIC_OR_DEPARTMENT_OTHER): Admit: 2016-07-02 | Discharge: 2016-07-02 | Disposition: A | Discharge status: Home / Self Care | Payer: 59

## 2016-07-02 ENCOUNTER — Emergency Department (HOSPITAL_COMMUNITY)
Admission: EM | Admit: 2016-07-02 | Discharge: 2016-07-02 | Disposition: A | Discharge status: Home / Self Care | Payer: 59 | Attending: Emergency Medicine | Admitting: Emergency Medicine

## 2016-07-02 DIAGNOSIS — Z87891 Personal history of nicotine dependence: Secondary | ICD-10-CM | POA: Insufficient documentation

## 2016-07-02 DIAGNOSIS — M79609 Pain in unspecified limb: Secondary | ICD-10-CM | POA: Diagnosis not present

## 2016-07-02 DIAGNOSIS — M7989 Other specified soft tissue disorders: Secondary | ICD-10-CM | POA: Diagnosis not present

## 2016-07-02 DIAGNOSIS — M79661 Pain in right lower leg: Secondary | ICD-10-CM | POA: Diagnosis present

## 2016-07-02 DIAGNOSIS — Z96653 Presence of artificial knee joint, bilateral: Secondary | ICD-10-CM | POA: Insufficient documentation

## 2016-07-02 DIAGNOSIS — M79604 Pain in right leg: Secondary | ICD-10-CM

## 2016-07-02 DIAGNOSIS — R600 Localized edema: Secondary | ICD-10-CM | POA: Insufficient documentation

## 2016-07-02 LAB — I-STAT CHEM 8, ED
BUN: 12 mg/dL (ref 6–20)
CALCIUM ION: 1.09 mmol/L — AB (ref 1.15–1.40)
CHLORIDE: 99 mmol/L — AB (ref 101–111)
Creatinine, Ser: 0.7 mg/dL (ref 0.44–1.00)
GLUCOSE: 81 mg/dL (ref 65–99)
HCT: 26 % — ABNORMAL LOW (ref 36.0–46.0)
Hemoglobin: 8.8 g/dL — ABNORMAL LOW (ref 12.0–15.0)
Potassium: 3.4 mmol/L — ABNORMAL LOW (ref 3.5–5.1)
Sodium: 141 mmol/L (ref 135–145)
TCO2: 29 mmol/L (ref 0–100)

## 2016-07-02 NOTE — ED Provider Notes (Signed)
[facility]-EMERGENCY DEPT Provider Note   CSN: [id number] Arrival date & time: 07/02/16  1453     History   Chief Complaint Chief Complaint  Patient presents with  . Leg Pain  . Leg Swelling    HPI Latasha Pokeerra N Snyder is a 40 y.o. female.   Patient presents with bilateral leg pain and swelling worsen the left. Patient denies classic blood clot risk factors. Patient denies any significant trauma. Worse for proximal me 3 weeks. Patient does stand often through the day. Symptoms constant no shortness of breath or chest pain. No fevers or chills.      Past Medical History:  Diagnosis Date  . Morbid obesity (HCC)     There are no active problems to display for this patient.   Past Surgical History:  Procedure Laterality Date  . KNEE SURGERY    . TOTAL KNEE ARTHROPLASTY Bilateral     OB History    No data available       Home Medications    Prior to Admission medications   Medication Sig Start Date End Date Taking? Authorizing Provider  cephALEXin (KEFLEX) 500 MG capsule Take 1 capsule (500 mg total) by mouth 4 (four) times daily. 08/05/15   Chase PicketJaime Pilcher Ward, PA-C  ciprofloxacin (CIPRO) 500 MG tablet Take 1 tablet (500 mg total) by mouth 2 (two) times daily. 05/04/16   Deatra CanterWilliam J Oxford, FNP  ibuprofen (ADVIL,MOTRIN) 200 MG tablet Take 800 mg by mouth every 6 (six) hours as needed for headache or moderate pain.    Historical Provider, MD  phenazopyridine (PYRIDIUM) 200 MG tablet Take 1 tablet (200 mg total) by mouth 3 (three) times daily. 05/04/16   Deatra CanterWilliam J Oxford, FNP  Pseudoephedrine-APAP-DM (DAYQUIL PO) Take 2 tablets by mouth 3 (three) times daily as needed (cold symptoms).    Historical Provider, MD    Family History No family history on file.  Social History Social History  Substance Use Topics  . Smoking status: Former Games developermoker  . Smokeless tobacco: Never Used  . Alcohol use Yes     Allergies   Review of patient's allergies indicates no known  allergies.   Review of Systems Review of Systems  Constitutional: Negative for chills and fever.  HENT: Negative for congestion.   Eyes: Negative for visual disturbance.  Respiratory: Negative for shortness of breath.   Cardiovascular: Positive for leg swelling. Negative for chest pain.  Gastrointestinal: Negative for abdominal pain and vomiting.  Genitourinary: Negative for dysuria and flank pain.  Musculoskeletal: Negative for back pain, neck pain and neck stiffness.  Skin: Negative for rash.  Neurological: Negative for light-headedness and headaches.     Physical Exam Updated Vital Signs BP 135/70 (BP Location: Left Arm)   Pulse 66   Temp 98.1 F (36.7 C) (Oral)   Resp 16   Wt (!) 308 lb 4.8 oz (139.8 kg)   LMP 06/01/2016   SpO2 100%   Physical Exam  Constitutional: She is oriented to person, place, and time. She appears well-developed and well-nourished.  HENT:  Head: Normocephalic and atraumatic.  Eyes: Conjunctivae are normal. Right eye exhibits no discharge. Left eye exhibits no discharge.  Neck: Normal range of motion. Neck supple. No tracheal deviation present.  Cardiovascular: Normal rate and regular rhythm.   Pulmonary/Chest: Effort normal and breath sounds normal.  Abdominal: Soft. She exhibits no distension. There is no tenderness. There is no guarding.  Musculoskeletal: She exhibits edema and tenderness.  Mild edema bilateral lower extremities, mild tenderness  to palpation anterior lower extremities soft compartments. Neurovascularly intact lower extremities. No signs of infection  Neurological: She is alert and oriented to person, place, and time.  Skin: Skin is warm. No rash noted.  Psychiatric: She has a normal mood and affect.  Nursing note and vitals reviewed.    ED Treatments / Results  Labs (all labs ordered are listed, but only abnormal results are displayed) Labs Reviewed  I-STAT CHEM 8, ED - Abnormal; Notable for the following:       Result  Value   Potassium 3.4 (*)    Chloride 99 (*)    Calcium, Ion 1.09 (*)    Hemoglobin 8.8 (*)    HCT 26.0 (*)    All other components within normal limits    EKG  EKG Interpretation None       Radiology Dg Knee 2 Views Right  Result Date: 07/02/2016 CLINICAL DATA:  Right knee pain/swelling x3 weeks EXAM: RIGHT KNEE - 1-2 VIEW COMPARISON:  None. FINDINGS: No fracture or dislocation is seen. Right knee arthroplasty, without evidence of complication. Visualized soft tissues are within normal limits. No definite suprapatellar knee effusion IMPRESSION: No fracture or dislocation is seen. Right knee arthroplasty, without evidence of complication. Electronically Signed   By: Charline Bills M.D.   On: 07/02/2016 20:04   Dg Tibia/fibula Left  Result Date: 07/02/2016 CLINICAL DATA:  Distal tibial swelling/pain x3 weeks EXAM: LEFT TIBIA AND FIBULA - 2 VIEW COMPARISON:  None. FINDINGS: No fracture or dislocation is seen. Left knee arthroplasty.  No evidence of complication. Small suprapatellar knee joint effusion. Ankle mortise appears intact. Mild degenerative changes of the tibiotalar joint. IMPRESSION: No fracture or dislocation is seen. Left knee arthroplasty, without evidence of complication. Small suprapatellar the joint effusion. Mild degenerative changes of the ankle. Electronically Signed   By: Charline Bills M.D.   On: 07/02/2016 20:03    Procedures Procedures (including critical care time)  Medications Ordered in ED Medications - No data to display   Initial Impression / Assessment and Plan / ED Course  I have reviewed the triage vital signs and the nursing notes.  Pertinent labs & imaging results that were available during my care of the patient were reviewed by me and considered in my medical decision making (see chart for details).  Clinical Course   Patient presents with worsening leg edema and tenderness. Ultrasound performed by technician no blood clot. Patient  well-appearing kidney function normal stable for outpatient follow.  Results and differential diagnosis were discussed with the patient/parent/guardian. Xrays were independently reviewed by myself.  Close follow up outpatient was discussed, comfortable with the plan.   Medications - No data to display  Vitals:   07/02/16 1745 07/02/16 2000 07/02/16 2016 07/02/16 2030  BP: 128/69 135/76 135/70 124/72  Pulse: 78 (!) 58 66 70  Resp: 14 16 16 20   Temp:      TempSrc:      SpO2: 100% 100% 100% 100%  Weight:        Final diagnoses:  Bilateral leg edema  Right leg pain     Final Clinical Impressions(s) / ED Diagnoses   Final diagnoses:  Bilateral leg edema  Right leg pain    New Prescriptions New Prescriptions   No medications on file     Blane Ohara, MD 07/05/16 1557

## 2016-07-02 NOTE — ED Triage Notes (Signed)
Patient complains of bilateral leg swelling for 3 weeks, states that she has bilateral knee pain with same. Significant LLE swelling that she reports is normal for her, worse the past 3 weeks, denies trauma

## 2016-07-02 NOTE — Discharge Instructions (Signed)
Minimize salt intake, wear compressive stockings from pharmacy. If you were given medicines take as directed.  If you are on coumadin or contraceptives realize their levels and effectiveness is altered by many different medicines.  If you have any reaction (rash, tongues swelling, other) to the medicines stop taking and see a physician.    If your blood pressure was elevated in the ER make sure you follow up for management with a primary doctor or return for chest pain, shortness of breath or stroke symptoms.  Please follow up as directed and return to the ER or see a physician for new or worsening symptoms.  Thank you. Vitals:   07/02/16 1519 07/02/16 1745 07/02/16 2000 07/02/16 2016  BP: 160/85 128/69 135/76 135/70  Pulse: 76 78 (!) 58 66  Resp: 18 14 16 16   Temp: 98.1 F (36.7 C)     TempSrc: Oral     SpO2: 100% 100% 100% 100%  Weight: (!) 308 lb 4.8 oz (139.8 kg)

## 2016-07-02 NOTE — Progress Notes (Addendum)
VASCULAR LAB PRELIMINARY  PRELIMINARY  PRELIMINARY  PRELIMINARY  Bilateral lower extremity venous duplex completed.    Preliminary report:  There is no obvious evidence of DVT or SVT noted in the visualized veins of the bilateral lower extremities. Non thrombosed varicosities noted in the distal calf.  Enlarged lymph node noted in the left groin/thigh.  Dellia Donnelly, RVT 07/02/2016, 7:41 PM

## 2016-12-14 ENCOUNTER — Other Ambulatory Visit (HOSPITAL_COMMUNITY): Payer: Self-pay | Admitting: Orthopedic Surgery

## 2016-12-14 DIAGNOSIS — M25561 Pain in right knee: Secondary | ICD-10-CM

## 2016-12-14 DIAGNOSIS — M25562 Pain in left knee: Principal | ICD-10-CM

## 2016-12-21 ENCOUNTER — Encounter (HOSPITAL_COMMUNITY)
Admission: RE | Admit: 2016-12-21 | Discharge: 2016-12-21 | Disposition: A | Discharge status: Home / Self Care | Payer: Medicaid Other | Source: Ambulatory Visit | Attending: Orthopedic Surgery | Admitting: Orthopedic Surgery

## 2016-12-21 ENCOUNTER — Encounter (HOSPITAL_COMMUNITY): Payer: Medicaid Other

## 2016-12-21 DIAGNOSIS — M25561 Pain in right knee: Secondary | ICD-10-CM | POA: Insufficient documentation

## 2016-12-21 DIAGNOSIS — M25562 Pain in left knee: Secondary | ICD-10-CM | POA: Diagnosis present

## 2016-12-21 MED ORDER — TECHNETIUM TC 99M MEDRONATE IV KIT
25.0000 | PACK | Freq: Once | INTRAVENOUS | Status: AC | PRN
  Administered 2016-12-21: 25 via INTRAVENOUS

## 2017-09-11 ENCOUNTER — Encounter: Payer: Self-pay | Admitting: Pulmonary Disease

## 2017-09-11 ENCOUNTER — Ambulatory Visit: Payer: Medicaid Other | Admitting: Pulmonary Disease

## 2017-09-11 DIAGNOSIS — I10 Essential (primary) hypertension: Secondary | ICD-10-CM | POA: Diagnosis not present

## 2017-09-11 DIAGNOSIS — G4733 Obstructive sleep apnea (adult) (pediatric): Secondary | ICD-10-CM | POA: Diagnosis not present

## 2017-09-11 NOTE — Assessment & Plan Note (Signed)
?   White coat Noted high multiple times  I have asked her to keep a record including measuring 8 out of doctor's office when she goes in for her PCP appointment  Next.   certainly sleep apnea or hypertension would be a second qualifying condition for bariatric surgery

## 2017-09-11 NOTE — Assessment & Plan Note (Signed)
Given excessive daytime somnolence, narrow pharyngeal exam, witnessed apneas & loud snoring, obstructive sleep apnea is very likely & an overnight polysomnogram will be scheduled as a split study. The pathophysiology of obstructive sleep apnea , it's cardiovascular consequences & modes of treatment including CPAP were discused with the patient in detail & they evidenced understanding.  Pretest prob is high 

## 2017-09-11 NOTE — Patient Instructions (Signed)
Schedule sleep study

## 2017-09-11 NOTE — Progress Notes (Signed)
Subjective:    Patient ID: Latasha Snyder, female    DOB: [birth year], 41 y.o.   MRN: [id number]  HPI   41 year old morbidly obese woman presents for evaluation of sleep disordered breathing. She is undergoing evaluation for pediatric surgery at [facility]. She reports non-refreshing sleep.  Loud snoring has been noted by family members including her brother that stays in her house.  She reports choking or gasping episodes almost every night that wake her up from sleep She has severe knee osteoarthritis and a few blood pressure readings have been noted to be high although she has not been formally diagnosed with hypertension   Epworth sleepiness score is 21 and she reports sleepiness in various situations such as a passenger in a car, afternoons or watching TV.  Bedtime is not fixed and varies between 9 PM to midnight, sleep latency is minimal, due to shoulder pain she generally sleeps on her back with one clearly nocturia and is out of bed anywhere between 9:53 AM feeling tired with occasional headaches and dryness of mouth. She has gained about 60 pounds in the last 2 years to her current weight of 311 pounds.  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     Past Medical History:  Diagnosis Date  . Morbid obesity (HCC)     Past Surgical History:  Procedure Laterality Date  . KNEE SURGERY    . TOTAL KNEE ARTHROPLASTY Bilateral   Cholecystectomy and tubal ligation   No Known Allergies  Social History   Socioeconomic History  . Marital status: Single    Spouse name: Not on file  . Number of children: Not on file  . Years of education: Not on file  . Highest education level: Not on file  Social Needs  . Financial resource strain: Not on file  . Food insecurity - worry: Not on file  . Food insecurity -  inability: Not on file  . Transportation needs - medical: Not on file  . Transportation needs - non-medical: Not on file  Occupational History  . Not on file  Tobacco Use  . Smoking status: Former Games developermoker  . Smokeless tobacco: Never Used  Substance and Sexual Activity  . Alcohol use: Yes  . Drug use: No  . Sexual activity: No    Birth control/protection: None  Other Topics Concern  . Not on file  Social History Narrative  . Not on file       History reviewed. No pertinent family history.  Review of Systems   Positive for knee pains  Constitutional: negative for anorexia, fevers and sweats  Eyes: negative for irritation, redness and visual disturbance  Ears, nose, mouth, throat, and face: negative for earaches, epistaxis, nasal congestion and sore throat  Respiratory: negative for cough, dyspnea on exertion, sputum and wheezing  Cardiovascular: negative for chest pain, dyspnea, lower extremity edema, orthopnea, palpitations and syncope  Gastrointestinal: negative for abdominal pain, constipation, diarrhea, melena, nausea and vomiting  Genitourinary:negative for dysuria, frequency and hematuria  Hematologic/lymphatic: negative for bleeding, easy bruising and lymphadenopathy  Musculoskeletal:negative for arthralgias, muscle weakness and stiff joints  Neurological: negative for coordination problems, gait problems, headaches and weakness  Endocrine: negative for diabetic symptoms including polydipsia, polyuria and weight loss     Objective:   Physical Exam  Gen. Pleasant, obese, in no distress,  normal affect ENT - enlargedtonsils, no post nasal drip, class 2-3 airway Neck: No JVD, no thyromegaly, no carotid bruits Lungs: no use of accessory muscles, no dullness to percussion, decreased without rales or rhonchi  Cardiovascular: Rhythm regular, heart sounds  normal, no murmurs or gallops, no peripheral edema Abdomen: soft and non-tender, no hepatosplenomegaly, BS  normal. Musculoskeletal: No deformities, no cyanosis or clubbing Neuro:  alert, non focal, no tremors       Assessment & Plan:

## 2017-09-24 ENCOUNTER — Ambulatory Visit (HOSPITAL_BASED_OUTPATIENT_CLINIC_OR_DEPARTMENT_OTHER): Payer: Medicaid Other | Attending: Pulmonary Disease | Admitting: Pulmonary Disease

## 2017-09-24 VITALS — Ht 62.0 in | Wt 311.0 lb

## 2017-09-24 DIAGNOSIS — G4733 Obstructive sleep apnea (adult) (pediatric): Secondary | ICD-10-CM | POA: Insufficient documentation

## 2017-09-29 NOTE — Procedures (Signed)
Patient Name: Latasha Snyder, Latasha Snyder Study Date: 09/24/2017 Gender: Female D.O.B: 1976-10-01 Age (years): 41 Referring Provider: Cyril Mourningakesh Alva MD, ABSM Height (inches): 62 Interpreting Physician: Cyril Mourningakesh Alva MD, ABSM Weight (lbs): 311 RPSGT: Celene KrasCharles, Nicole BMI: 57 MRN: [id number] Neck Size: 15.50   CLINICAL INFORMATION Sleep Study Type: NPSG  Indication for sleep study: OSA  Epworth Sleepiness Score: 17    SLEEP STUDY TECHNIQUE As per the AASM Manual for the Scoring of Sleep and Associated Events v2.3 (April 2016) with a hypopnea requiring 4% desaturations.  The channels recorded and monitored were frontal, central and occipital EEG, electrooculogram (EOG), submentalis EMG (chin), nasal and oral airflow, thoracic and abdominal wall motion, anterior tibialis EMG, snore microphone, electrocardiogram, and pulse oximetry.  SLEEP ARCHITECTURE The study was initiated at 10:31:00 PM and ended at 4:31:25 AM.  Sleep onset time was 45.8 minutes and the sleep efficiency was 74.1%. The total sleep time was 267.0 minutes.  Stage REM latency was 31.0 minutes.  The patient spent 3.37% of the night in stage N1 sleep, 64.61% in stage N2 sleep, 0.75% in stage N3 and 31.27% in REM.  Alpha intrusion was absent.  Supine sleep was 100.00%.  RESPIRATORY PARAMETERS The overall apnea/hypopnea index (AHI) was 8.5 per hour. There were 9 total apneas, including 7 obstructive, 2 central and 0 mixed apneas. There were 29 hypopneas and 1 RERAs.  The AHI during Stage REM sleep was 18.7 per hour.  AHI while supine was 8.5 per hour.  The mean oxygen saturation was 97.57%. The minimum SpO2 during sleep was 80.00%.  soft snoring was noted during this study.  CARDIAC DATA The 2 lead EKG demonstrated sinus rhythm. The mean heart rate was 72.45 beats per minute. Other EKG findings include: PVCs. LEG MOVEMENT DATA The total PLMS were 0 with a resulting PLMS index of 0.00. Associated arousal with leg movement  index was 0.0 .  IMPRESSIONS - Mild obstructive sleep apnea occurred during this study (AHI = 8.5/h), predominantly during REM supine sleep - No significant central sleep apnea occurred during this study (CAI = 0.4/h). - Moderate oxygen desaturation was noted during this study (Min O2 = 80.00%). - The patient snored with soft snoring volume. - EKG findings include PVCs. - Clinically significant periodic limb movements did not occur during sleep. No significant associated arousals.   DIAGNOSIS - Obstructive Sleep Apnea (327.23 [G47.33 ICD-10])   RECOMMENDATIONS  - Cardiovascular risk of mild OSA is low. COnsider weight loss alone or positional therapy - If very symptomatic, can use autoCPAP or therapeutic CPAP titration to determine optimal pressure . - Avoid alcohol, sedatives and other CNS depressants that may worsen sleep apnea and disrupt normal sleep architecture. - Sleep hygiene should be reviewed to assess factors that may improve sleep quality. - Weight management and regular exercise should be initiated or continued if appropriate.  Cyril Mourningakesh Alva MD Board Certified in Sleep medicine

## 2017-11-03 ENCOUNTER — Ambulatory Visit: Payer: Medicaid Other | Attending: Orthopedic Surgery | Admitting: Physical Therapy

## 2017-11-03 ENCOUNTER — Encounter: Payer: Self-pay | Admitting: Physical Therapy

## 2017-11-03 DIAGNOSIS — M25562 Pain in left knee: Secondary | ICD-10-CM | POA: Insufficient documentation

## 2017-11-03 DIAGNOSIS — R262 Difficulty in walking, not elsewhere classified: Secondary | ICD-10-CM | POA: Insufficient documentation

## 2017-11-03 DIAGNOSIS — G8929 Other chronic pain: Secondary | ICD-10-CM | POA: Diagnosis present

## 2017-11-03 DIAGNOSIS — M25561 Pain in right knee: Secondary | ICD-10-CM | POA: Insufficient documentation

## 2017-11-06 ENCOUNTER — Encounter: Payer: Self-pay | Admitting: Physical Therapy

## 2017-11-06 NOTE — Therapy (Signed)
[facility] [address] [place], [place], [address] Phone: [phone]   Fax:  [phone]  Physical Therapy Evaluation  Patient Details  Name: Latasha Pokeerra N Delfin MRN: [id number] Date of Birth: [birth year] Referring Provider: Dr Durene RomansMatthew Olin     Encounter Date: 11/03/2017  PT End of Session - 11/06/17 1035    Visit Number  1    Number of Visits  4    Date for PT Re-Evaluation  12/11/17    Authorization Type  Medicaid. 3 visits then may submit for more visits     PT Start Time  1015    PT Stop Time  1059    PT Time Calculation (min)  44 min    Activity Tolerance  Patient tolerated treatment well    Behavior During Therapy  WFL for tasks assessed/performed       Past Medical History:  Diagnosis Date  . Morbid obesity (HCC)     Past Surgical History:  Procedure Laterality Date  . KNEE SURGERY    . TOTAL KNEE ARTHROPLASTY Bilateral     There were no vitals filed for this visit.   Subjective Assessment - 11/06/17 1050    Subjective  Patient had bilateral total knee replacements in 2011. 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 The pain has now increased to the point where she couldnt walk.     Limitations  Standing;Walking    How long can you walk comfortably?  Has to use the cart in the grocery store     Diagnostic tests  Has had imaging at [facility]. Per Patient degenration of the protheisis noted in both knees.     Currently in Pain?  Yes    Pain Score  10-Worst pain ever    Pain Location  Knee    Pain Orientation  Right    Pain Descriptors / Indicators  Aching    Pain Type  Chronic pain    Pain Onset  More than a month ago    Pain Frequency  Constant    Aggravating Factors   standing and walking     Pain Relieving Factors  rest     Effect of Pain on Daily Activities  difficulty perfroming ADL's     Pain Location  Knee    Pain Orientation  Left    Pain Descriptors  / Indicators  Aching    Pain Type  Chronic pain    Pain Onset  More than a month ago    Pain Frequency  Constant    Aggravating Factors   standing and walking     Pain Relieving Factors  rest and ice     Effect of Pain on Daily Activities  difficulty perfroming ADL's          [facility] - 11/06/17 0001      Assessment   Medical Diagnosis  Bilateral knee pain     Referring Provider  Dr Durene RomansMatthew Olin     Onset Date/Surgical Date  -- 2-3 years     Hand Dominance  Right    Next MD Visit  Nothing Scheduled     Prior Therapy  When she had knee replacements in 2011       Precautions   Precautions  None      Restrictions   Weight Bearing Restrictions  No      Balance Screen   Has the patient fallen  in the past 6 months  No    Has the patient had a decrease in activity level because of a fear of falling?   No    Is the patient reluctant to leave their home because of a fear of falling?   No      Home Environment   Additional Comments  3 steps into the house       Prior Function   Level of Independence  Independent    Vocation  On disability    Leisure  Going to the gym       Cognition   Overall Cognitive Status  Within Functional Limits for tasks assessed    Attention  Focused    Focused Attention  Appears intact    Memory  Appears intact    Awareness  Appears intact    Problem Solving  Appears intact      Sensation   Light Touch  Appears Intact    Additional Comments  Denies parathesias       Coordination   Gross Motor Movements are Fluid and Coordinated  Yes    Fine Motor Movements are Fluid and Coordinated  Yes      Posture/Postural Control   Posture/Postural Control  Postural limitations    Postural Limitations  Rounded Shoulders      AROM   Right Knee Extension  0    Right Knee Flexion  114    Left Knee Extension  0    Left Knee Flexion  102      PROM   Right Knee Extension  0    Right Knee Flexion  118    Left Knee Extension  0    Left Knee  Flexion  108      Strength   Right Hip Flexion  4/5    Right Hip ABduction  4/5    Right Hip ADduction  4/5    Left Hip Flexion  4/5    Left Hip ABduction  4/5    Left Hip ADduction  4/5      Flexibility   Soft Tissue Assessment /Muscle Length  yes    Hamstrings  Minor deficits in hamstring length       Palpation   Patella mobility  Painful patella motion. Left patella is sitting signifcantly lateral. her right is lateral as well but not as much,     Palpation comment  Apparent leg length descrpency R shorter hten left       Transfers   Comments  instability transfring from sit to stand with cane. CGA with cane.       Ambulation/Gait   Gait Comments  decreased bilateral hip flexion; Bilateral hip abduction with gait. Bilateral knee verus. Bilateral foot supination with gait.              Objective measurements completed on examination: See above findings.      [facility] Adult PT Treatment/Exercise - 11/06/17 0001      Lumbar Exercises: Seated   Other Seated Lumbar Exercises  ball squeeze x10; seated clamshell x10 red;       Lumbar Exercises: Supine   AB Set Limitations  quad set x10; glut set x10;       Knee/Hip Exercises: Stretches   Passive Hamstring Stretch Limitations  with strap and seated x30 second each leg             PT Education - 11/06/17 1051    Education provided  Yes  Education Details  HEP; symptom mangement;     Person(s) Educated  Patient;Spouse    Methods  Explanation;Demonstration;Tactile cues;Verbal cues    Comprehension  Verbalized understanding;Returned demonstration;Verbal cues required;Tactile cues required       PT Short Term Goals - 11/06/17 1043      PT SHORT TERM GOAL #1   Title  Patient will improve gorss bilateral lower extremity strength to 4+/5     Baseline  bilateral hip flexion and abduction 4/5     Time  4    Period  Weeks    Status  New    Target Date  12/04/17      PT SHORT TERM GOAL #2   Title  Patient will  walk 200' with rolling walker without increased pain     Baseline  Pain and difficulty walking with cane     Time  4    Period  Weeks    Status  New    Target Date  12/04/17      PT SHORT TERM GOAL #3   Title  Patient will be independent with intial HEP     Baseline  No HEP     Time  4    Period  Weeks    Status  New    Target Date  12/04/17      PT SHORT TERM GOAL #4   Title  Patient will transfer sit to stand independently with LRAD     Baseline  contact guard with transfer from sit to stand     Time  4    Period  Weeks    Status  New    Target Date  12/04/17        PT Long Term Goals - 11/06/17 1047      PT LONG TERM GOAL #1   Title  Will be assessed after intial POC              Plan - 11/06/17 1036    Clinical Impression Statement  Patient is a 42 year old female with bilateral knee pain. She will require bilateral knee replacements but first needs to loose weight and get streonger. At this time she is very unstable walking with her cane. She would benefit from the use of a rolling walker. She is very motivated to loose weight and improve her strength. She was given light LE exercises to start with. She would beneift from skilled therapy to improve ability to ambualte and to improve her outcome when she does have surgery for her knees. She was seen for a moderate complexity evalaution.     History and Personal Factors relevant to plan of care:  Past bilateral total knees; Obesity;     Clinical Presentation  Evolving    Clinical Presentation due to:  increasing pain and decreasing ability to ambualte safely     Clinical Decision Making  Moderate    Rehab Potential  Good    Clinical Impairments Affecting Rehab Potential  bilateral arthritis;     PT Frequency  1x / week    PT Duration  4 weeks    PT Treatment/Interventions  ADLs/Self Care Home Management;Cryotherapy;Electrical Stimulation;Iontophoresis 4mg /ml Dexamethasone;Moist Heat;Ultrasound;Therapeutic  activities;Neuromuscular re-education;Cognitive remediation;Manual techniques;Taping;Splinting    PT Next Visit Plan  review HEP; exercise bike; SAQ; review light standing exercises. Patient may start going to the pool. thomas stretch; gastroc stretch;     PT Home Exercise Plan  quad set; glut set; SLR; clam shell; bal squeze,  hamstring stretch     Consulted and Agree with Plan of Care  Patient       Patient will benefit from skilled therapeutic intervention in order to improve the following deficits and impairments:  Abnormal gait, Pain, Decreased activity tolerance, Decreased endurance, Decreased range of motion, Decreased strength, Decreased knowledge of use of DME  Visit Diagnosis: Chronic pain of left knee  Chronic pain of right knee  Difficulty in walking, not elsewhere classified     Problem List Patient Active Problem List   Diagnosis Date Noted  . OSA (obstructive sleep apnea) 09/11/2017  . Hypertension, essential 09/11/2017    Dessie Coma  PT DPT  11/06/2017, 10:52 AM  [facility] [address] [place], [place], [address] Phone: [phone]   Fax:  [phone]  Name: Latasha Snyder MRN: [id number] Date of Birth: 08-03-1976

## 2017-11-23 ENCOUNTER — Ambulatory Visit: Payer: Medicaid Other | Admitting: Physical Therapy

## 2017-12-01 ENCOUNTER — Encounter: Payer: Self-pay | Admitting: Physical Therapy

## 2017-12-01 ENCOUNTER — Ambulatory Visit: Payer: Medicaid Other | Attending: Orthopedic Surgery | Admitting: Physical Therapy

## 2017-12-01 DIAGNOSIS — R262 Difficulty in walking, not elsewhere classified: Secondary | ICD-10-CM | POA: Insufficient documentation

## 2017-12-01 DIAGNOSIS — G8929 Other chronic pain: Secondary | ICD-10-CM | POA: Diagnosis present

## 2017-12-01 DIAGNOSIS — M25561 Pain in right knee: Secondary | ICD-10-CM | POA: Diagnosis present

## 2017-12-01 DIAGNOSIS — M25562 Pain in left knee: Secondary | ICD-10-CM | POA: Insufficient documentation

## 2017-12-01 NOTE — Therapy (Addendum)
[place], [place], [address] Phone: [phone]   Fax:  [phone]  Physical Therapy Treatment/ Discharge   Patient Details  Name: Latasha Snyder MRN: [id number] Date of Birth: 23-Jun-1976 Referring Provider: Dr Paralee Cancel    Encounter Date: 12/01/2017  PT End of Session - 12/01/17 1100    Visit Number  2    Number of Visits  4    Date for PT Re-Evaluation  12/11/17    Authorization Type  Medicaid. 3 visits then may submit for more visits     PT Start Time  1015    PT Stop Time  1055    PT Time Calculation (min)  40 min    Activity Tolerance  Patient tolerated treatment well    Behavior During Therapy  WFL for tasks assessed/performed       Past Medical History:  Diagnosis Date  . Morbid obesity ([place])     Past Surgical History:  Procedure Laterality Date  . KNEE SURGERY    . TOTAL KNEE ARTHROPLASTY Bilateral     There were no vitals filed for this visit.  Subjective Assessment - 12/01/17 1026    Subjective  Patient reports her knees ahve been sore with the rain. She had to take a muscle relaxer today. She has been working on her exercises at home.     Limitations  Standing;Walking    How long can you walk comfortably?  Has to use the cart in the grocery store     Diagnostic tests  Has had imaging at [facility]. Per Patient degenration of the protheisis noted in both knees.     Currently in Pain?  Yes    Pain Score  9     Pain Orientation  Right    Pain Descriptors / Indicators  Aching    Pain Type  Chronic pain    Pain Onset  More than a month ago    Pain Frequency  Constant    Aggravating Factors   standing and walkin     Pain Relieving Factors  rest     Effect of Pain on Daily Activities  difficulty perfroming ADL's     Pain Score  9    Pain Location  Knee    Pain Orientation  Left    Pain Descriptors / Indicators  Aching    Pain Type  Chronic pain    Pain Onset  More than a month ago     Pain Frequency  Constant    Aggravating Factors   standing and walking     Pain Relieving Factors  rest and ice     Effect of Pain on Daily Activities  difficulty perfroming ADL's                       [facility] Adult PT Treatment/Exercise - 12/01/17 0001      Lumbar Exercises: Seated   Other Seated Lumbar Exercises  ball squeeze 2x10; seated clamshell 2x10 red;     Other Seated Lumbar Exercises  heel raise 2x10       Lumbar Exercises: Supine   AB Set Limitations  quad set 2x10; glut set 2x10;     Heel Slides  Limitations    Heel Slides Limitations  with strap cuing to use hamstring but help with strap. Not to full range     Bent Knee Raise  Limitations    Bent Knee Raise Limitations  2x10  Knee/Hip Exercises: Stretches   Passive Hamstring Stretch Limitations  seated at the edge of the table 3x20 sec ond hold              PT Education - 12/01/17 1030    Education provided  Yes    Education Details  reviewed HEP     Person(s) Educated  Patient    Methods  Explanation;Demonstration;Tactile cues;Verbal cues    Comprehension  Verbalized understanding;Returned demonstration;Verbal cues required;Tactile cues required;Need further instruction       PT Short Term Goals - 11/06/17 1043      PT SHORT TERM GOAL #1   Title  Patient will improve gorss bilateral lower extremity strength to 4+/5     Baseline  bilateral hip flexion and abduction 4/5     Time  4    Period  Weeks    Status  New    Target Date  12/04/17      PT SHORT TERM GOAL #2   Title  Patient will walk 200' with rolling walker without increased pain     Baseline  Pain and difficulty walking with cane     Time  4    Period  Weeks    Status  New    Target Date  12/04/17      PT SHORT TERM GOAL #3   Title  Patient will be independent with intial HEP     Baseline  No HEP     Time  4    Period  Weeks    Status  New    Target Date  12/04/17      PT SHORT TERM GOAL #4   Title  Patient  will transfer sit to stand independently with LRAD     Baseline  contact guard with transfer from sit to stand     Time  4    Period  Weeks    Status  New    Target Date  12/04/17        PT Long Term Goals - 11/06/17 1047      PT LONG TERM GOAL #1   Title  Will be assessed after intial POC             Plan - 12/01/17 1148    Clinical Impression Statement  Patient tolerated treatment well. She had a minor increase in pain with knee flexion. She was given an updated HEP. She was advised to follow your symptoms at home.     Clinical Presentation  Evolving    Clinical Decision Making  Moderate    Rehab Potential  Good    PT Frequency  1x / week    PT Duration  4 weeks    PT Treatment/Interventions  ADLs/Self Care Home Management;Cryotherapy;Electrical Stimulation;Iontophoresis '4mg'$ /ml Dexamethasone;Moist Heat;Ultrasound;Therapeutic activities;Neuromuscular re-education;Cognitive remediation;Manual techniques;Taping;Splinting    PT Next Visit Plan  review HEP; exercise bike; SAQ; review light standing exercises. Patient may start going to the pool. thomas stretch; gastroc stretch; review staning weight shifts;     PT Home Exercise Plan  quad set; glut set; SLR; clam shell; bal squeze, hamstring stretch     Consulted and Agree with Plan of Care  Patient       Patient will benefit from skilled therapeutic intervention in order to improve the following deficits and impairments:  Abnormal gait, Pain, Decreased activity tolerance, Decreased endurance, Decreased range of motion, Decreased strength, Decreased knowledge of use of DME  Visit Diagnosis: Chronic pain of left knee  Chronic pain of right knee  Difficulty in walking, not elsewhere classified  PHYSICAL THERAPY DISCHARGE SUMMARY  Visits from Start of Care: 2  Current functional level related to goals / functional outcomes: Patient did not return for follow up   Remaining deficits: Unknown    Education /  Equipment: HEP   Plan: Patient agrees to discharge.  Patient goals were not met. Patient is being discharged due to not returning since the last visit.  ?????       Problem List Patient Active Problem List   Diagnosis Date Noted  . OSA (obstructive sleep apnea) 09/11/2017  . Hypertension, essential 09/11/2017    Carney Living  PT DPT  12/01/2017, 11:55 AM     [facility] [address] [place], [place], [address] Phone: [phone]   Fax:  [phone]  Name: Latasha Snyder MRN: [id number] Date of Birth: [birth year]

## 2017-12-05 ENCOUNTER — Ambulatory Visit: Payer: Medicaid Other | Admitting: Physical Therapy

## 2017-12-08 ENCOUNTER — Ambulatory Visit: Payer: Medicaid Other | Attending: Orthopedic Surgery | Admitting: Physical Therapy

## 2018-08-26 IMAGING — NM NM BONE 3 PHASE
6 series · 16 of 16 positions shown · non-contrast
Comparison: None

Radiographic correlation:  BILATERAL knee radiographs 07/02/2016

CLINICAL DATA: BILATERAL knee pain, post BILATERAL TKR in 0077
question prosthetic loosening

EXAM:
NUCLEAR MEDICINE 3-PHASE BONE SCAN
TECHNIQUE: Radionuclide angiographic images, immediate static blood pool
images, and 3-hour delayed static images were obtained of the knees
after intravenous injection of radiopharmaceutical.
RADIOPHARMACEUTICALS:  21.5 mCi 3c-MMm MDP IV

[Series 1: flow · 2.07mm/px · 6 of 48 frames shown (1 of 2)]
[frame 5/48]
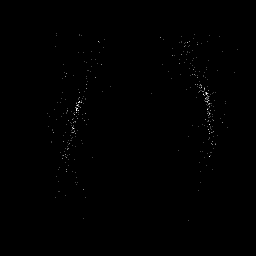
[frame 13/48  full-range]
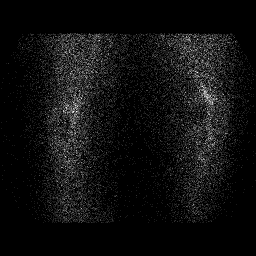
[frame 21/48  full-range]
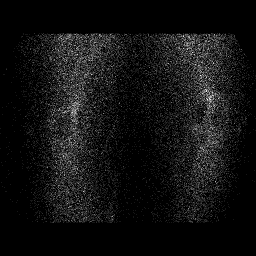
[frame 29/48  full-range]
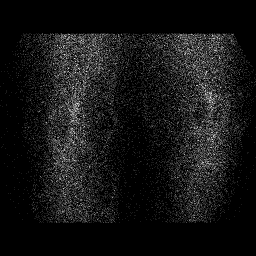
[frame 37/48  full-range]
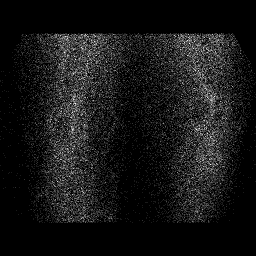
[frame 45/48  full-range]
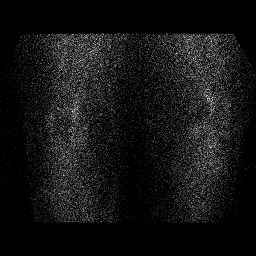

[Series 1: flow · 2.07mm/px · 6 of 48 frames shown (2 of 2)]
[frame 5/48]
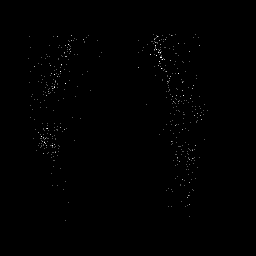
[frame 13/48  full-range]
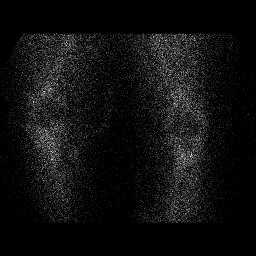
[frame 21/48  full-range]
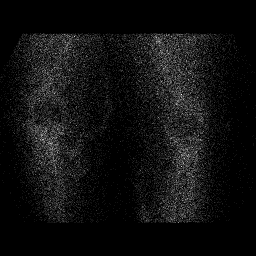
[frame 29/48  full-range]
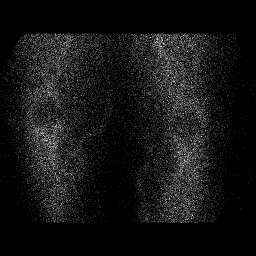
[frame 37/48  full-range]
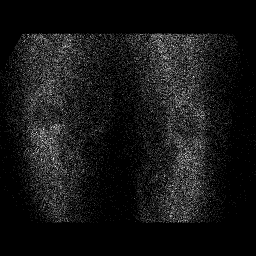
[frame 45/48  full-range]
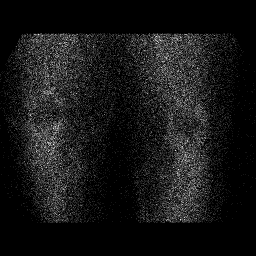

[Series 2: blood pool · 2.07mm/px · 1 of 1 slices shown (1 of 2)]
[im 1/1  full-range]
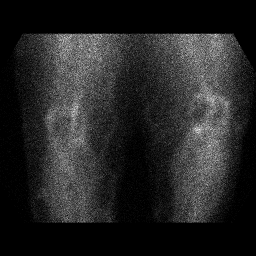

[Series 2: blood pool · 2.07mm/px · 1 of 1 slices shown (2 of 2)]
[im 1/1  full-range]
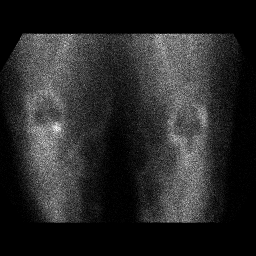

[Series 3: lat bp · 2.07mm/px · 1 of 1 slices shown (1 of 2)]
[im 1/1  full-range]
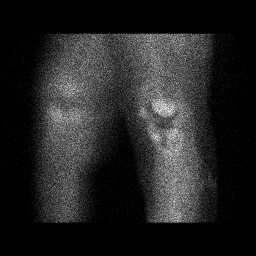

[Series 3: lat bp · 2.07mm/px · 1 of 1 slices shown (2 of 2)]
[im 1/1]
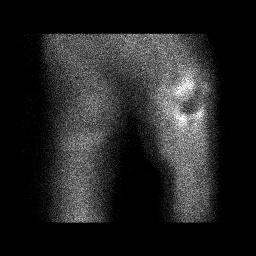

[16 of 16 positions shown; findings below may reference images not displayed]

FINDINGS: Vascular phase: Minimally increased blood flow surrounding the knee
joints bilaterally

Blood pool phase: Mild increased blood pool surrounding the knee
joints bilaterally

Delayed phase: Focal increased delayed tracer localization at the
medial RIGHT tibial plateau raising question of tibial component
loosening. Otherwise mild diffuse uptake at the knees bilaterally
without additional areas of focal increased tracer.
IMPRESSION: Increased blood flow and blood pool of tracer at the knees
bilaterally raising question of synovitis.

Focally increased tracer localization at medial RIGHT tibial plateau
raising question of loosening or less likely infection at the tibial
component.

No scintigraphic evidence of loosening of LEFT knee prosthesis.

## 2018-12-12 ENCOUNTER — Encounter: Payer: Self-pay | Admitting: Oncology

## 2018-12-12 ENCOUNTER — Telehealth: Payer: Self-pay | Admitting: Oncology

## 2018-12-12 NOTE — Telephone Encounter (Signed)
A new hem appt has been scheduled for Latasha Snyder to see Dr. Clelia Croft on 3/20 at 11:15am. PT aware to arrive 30 minutes early. Letter mailed.

## 2018-12-27 ENCOUNTER — Telehealth: Payer: Self-pay

## 2018-12-27 NOTE — Telephone Encounter (Signed)
Contacted patient for screening prior to appointment. Patient has not traveled recently, no respiratory symptoms or fever, and has not been around anyone that meets the criteria either. She is aware that she will be screened upon arrival for appointment as well.  

## 2018-12-28 ENCOUNTER — Other Ambulatory Visit: Payer: Self-pay

## 2018-12-28 ENCOUNTER — Telehealth: Payer: Self-pay | Admitting: Oncology

## 2018-12-28 ENCOUNTER — Inpatient Hospital Stay: Payer: Medicare Other

## 2018-12-28 ENCOUNTER — Inpatient Hospital Stay: Payer: Medicare Other | Attending: Oncology | Admitting: Oncology

## 2018-12-28 VITALS — BP 144/86 | HR 68 | Temp 98.0°F | Resp 18 | Ht 62.0 in | Wt 242.5 lb

## 2018-12-28 DIAGNOSIS — D649 Anemia, unspecified: Secondary | ICD-10-CM | POA: Insufficient documentation

## 2018-12-28 DIAGNOSIS — Z79899 Other long term (current) drug therapy: Secondary | ICD-10-CM

## 2018-12-28 DIAGNOSIS — R1013 Epigastric pain: Secondary | ICD-10-CM | POA: Diagnosis not present

## 2018-12-28 DIAGNOSIS — D509 Iron deficiency anemia, unspecified: Secondary | ICD-10-CM | POA: Diagnosis not present

## 2018-12-28 DIAGNOSIS — Z87891 Personal history of nicotine dependence: Secondary | ICD-10-CM

## 2018-12-28 DIAGNOSIS — R109 Unspecified abdominal pain: Secondary | ICD-10-CM

## 2018-12-28 DIAGNOSIS — D500 Iron deficiency anemia secondary to blood loss (chronic): Secondary | ICD-10-CM | POA: Diagnosis not present

## 2018-12-28 DIAGNOSIS — Z9884 Bariatric surgery status: Secondary | ICD-10-CM

## 2018-12-28 LAB — CBC WITH DIFFERENTIAL (CANCER CENTER ONLY)
Abs Immature Granulocytes: 0.02 10*3/uL (ref 0.00–0.07)
BASOS ABS: 0 10*3/uL (ref 0.0–0.1)
Basophils Relative: 1 %
EOS ABS: 0.1 10*3/uL (ref 0.0–0.5)
EOS PCT: 3 %
HEMATOCRIT: 32.6 % — AB (ref 36.0–46.0)
Hemoglobin: 9.6 g/dL — ABNORMAL LOW (ref 12.0–15.0)
Immature Granulocytes: 0 %
Lymphocytes Relative: 31 %
Lymphs Abs: 1.7 10*3/uL (ref 0.7–4.0)
MCH: 21.4 pg — AB (ref 26.0–34.0)
MCHC: 29.4 g/dL — AB (ref 30.0–36.0)
MCV: 72.8 fL — ABNORMAL LOW (ref 80.0–100.0)
MONO ABS: 0.4 10*3/uL (ref 0.1–1.0)
Monocytes Relative: 8 %
NRBC: 0 % (ref 0.0–0.2)
Neutro Abs: 3.1 10*3/uL (ref 1.7–7.7)
Neutrophils Relative %: 57 %
Platelet Count: 381 10*3/uL (ref 150–400)
RBC: 4.48 MIL/uL (ref 3.87–5.11)
RDW: 18.9 % — AB (ref 11.5–15.5)
WBC: 5.4 10*3/uL (ref 4.0–10.5)

## 2018-12-28 LAB — FERRITIN: FERRITIN: 65 ng/mL (ref 11–307)

## 2018-12-28 LAB — IRON AND TIBC
IRON: 15 ug/dL — AB (ref 41–142)
Saturation Ratios: 5 % — ABNORMAL LOW (ref 21–57)
TIBC: 301 ug/dL (ref 236–444)
UIBC: 287 ug/dL (ref 120–384)

## 2018-12-28 NOTE — Progress Notes (Signed)
Reason for the request:    Anemia  HPI: I was asked by Dr. Wynelle Link to evaluate [name] for the evaluation of iron deficiency anemia.  She is a 43 year old woman with history of obesity and arthritis who had a routine laboratory testing on January 22 of 2020.  Labs at that time showed white cell count of 6.5, hemoglobin of 9.9, MCV 75, RDW of 18.6 with a platelet count of 423.  Her white cell count differential was normal.  She was started on oral iron supplements since that time.  She had microcytic anemia dating back to 2011 with a hemoglobin have ranged between 9.6 and as low as 7.7 during hospitalization.  In 2012 her hemoglobin was 10.2 and in 2017 that hemoglobin was 8.8.  Since January, she reports that she has been erratic taking oral iron due to intolerance including dyspepsia and abdominal pain.  She also reported some constipation.  She has not been taking it regularly due to do symptoms.  She has reported some fatigue and tiredness.  No hematochezia or melena.  She had also gastric sleeve surgery which could be impacting her ability to withstand iron.  She does not report any headaches, blurry vision, syncope or seizures. Does not report any fevers, chills or sweats.  Does not report any cough, wheezing or hemoptysis.  Does not report any chest pain, palpitation, orthopnea or leg edema.  Does not report any nausea, vomiting or abdominal pain.  Does not report any constipation or diarrhea.  Does not report any skeletal complaints.    Does not report frequency, urgency or hematuria.  Does not report any skin rashes or lesions. Does not report any heat or cold intolerance.  Does not report any lymphadenopathy or petechiae.  Does not report any anxiety or depression.  Remaining review of systems is negative.    Past Medical History:  Diagnosis Date  . Morbid obesity (HCC)   :  Past Surgical History:  Procedure Laterality Date  . KNEE SURGERY    . TOTAL KNEE ARTHROPLASTY Bilateral    :   Current Outpatient Medications:  .  baclofen (LIORESAL) 20 MG tablet, take 1 tablet by mouth three times a day if needed, Disp: , Rfl: 0 .  diclofenac sodium (VOLTAREN) 1 % GEL, APPLY ONTO SKIN 4 TIMES DAILY AS NEEDED, Disp: , Rfl:  .  HYDROcodone-acetaminophen (NORCO/VICODIN) 5-325 MG tablet, take 1 tablet by mouth twice a day if needed, Disp: , Rfl:  .  meloxicam (MOBIC) 15 MG tablet, Take 15 mg by mouth daily., Disp: , Rfl: 0 .  traMADol (ULTRAM) 50 MG tablet, take 1 tablet by mouth every 6 hours if needed, Disp: , Rfl: 0:  No Known Allergies:  No family history on file.:  Social History   Socioeconomic History  . Marital status: Single    Spouse name: Not on file  . Number of children: Not on file  . Years of education: Not on file  . Highest education level: Not on file  Occupational History  . Not on file  Social Needs  . Financial resource strain: Not on file  . Food insecurity:    Worry: Not on file    Inability: Not on file  . Transportation needs:    Medical: Not on file    Non-medical: Not on file  Tobacco Use  . Smoking status: Former Games developer  . Smokeless tobacco: Never Used  Substance and Sexual Activity  . Alcohol use: Yes  . Drug  use: No  . Sexual activity: Never    Birth control/protection: None  Lifestyle  . Physical activity:    Days per week: Not on file    Minutes per session: Not on file  . Stress: Not on file  Relationships  . Social connections:    Talks on phone: Not on file    Gets together: Not on file    Attends religious service: Not on file    Active member of club or organization: Not on file    Attends meetings of clubs or organizations: Not on file    Relationship status: Not on file  . Intimate partner violence:    Fear of current or ex partner: Not on file    Emotionally abused: Not on file    Physically abused: Not on file    Forced sexual activity: Not on file  Other Topics Concern  . Not on file  Social History  Narrative  . Not on file  :  Pertinent items are noted in HPI.  Exam: Blood pressure (!) 144/86, pulse 68, temperature 98 F (36.7 C), temperature source Oral, resp. rate 18, height  (1.575 m), weight 242 lb 8 oz (110 kg), SpO2 100 %.   ECOG  General appearance: alert and cooperative appeared without distress. Head: atraumatic without any abnormalities. Eyes: conjunctivae/corneas clear. PERRL.  Sclera anicteric. Throat: lips, mucosa, and tongue normal; without oral thrush or ulcers. Resp: clear to auscultation bilaterally without rhonchi, wheezes or dullness to percussion. Cardio: regular rate and rhythm, S1, S2 normal, no murmur, click, rub or gallop GI: soft, non-tender; bowel sounds normal; no masses,  no organomegaly Skin: Skin color, texture, turgor normal. No rashes or lesions Lymph nodes: Cervical, supraclavicular, and axillary nodes normal. Neurologic: Grossly normal without any motor, sensory or deep tendon reflexes. Musculoskeletal: No joint deformity or effusion.  CBC    Component Value Date/Time   WBC 8.8 02/05/2010 0514   RBC 3.21 (L) 02/05/2010 0514   HGB 8.8 (L) 07/02/2016 2018   HCT 26.0 (L) 07/02/2016 2018   PLT 223 02/05/2010 0514   MCV 75.6 (L) 02/05/2010 0514   MCHC 31.9 02/05/2010 0514   RDW 20.6 (H) 02/05/2010 0514   LYMPHSABS 2.5 01/26/2010 0800   MONOABS 0.4 01/26/2010 0800   EOSABS 0.1 01/26/2010 0800   BASOSABS 0.0 01/26/2010 0800     Chemistry      Component Value Date/Time   NA 141 07/02/2016 2018   K 3.4 (L) 07/02/2016 2018   CL 99 (L) 07/02/2016 2018   CO2 21 02/04/2010 0425   BUN 12 07/02/2016 2018   CREATININE 0.70 07/02/2016 2018      Component Value Date/Time   CALCIUM 8.2 (L) 02/04/2010 0425       Assessment and Plan:    43 year old woman with the following:  1.  Microcytic anemia dating back to at least 2011.  Differential diagnosis includes iron deficiency anemia versus hemoglobinopathy or potentially both.  Iron  deficiency could be related to chronic menstrual blood losses and poor oral iron absorption.  From a management standpoint, would like to update her iron studies as well as a hemoglobin electrophoresis.  Option to replace her iron would include oral iron supplements versus intravenous iron.  Risks and benefits of Feraheme infusion were reviewed which include nausea, fatigue, infusion related complications and rarely anaphylaxis.  After discussion today, the plan is to recheck iron stores as well as hemoglobin electrophoresis.  If her iron levels are still  low which I anticipate they will, she is agreeable to proceed with Feraheme infusion.  She understands given her poor ability to absorb iron she might require intravenous iron repeatedly in the future.   2.  Follow-up: We will be in 4 months to repeat iron studies.   40  minutes was spent with the patient face-to-face today.  More than 50% of time was spent on reviewing laboratory data dating back to 2011, discussing differential diagnosis, management options and complications related to therapy.    Thank you for the referral.  I had the pleasure of meeting this patient today.  A copy of this consult has been forwarded to the requesting physician.

## 2018-12-28 NOTE — Telephone Encounter (Signed)
Gave avs and calendar ° °

## 2018-12-31 ENCOUNTER — Other Ambulatory Visit: Payer: Self-pay

## 2018-12-31 ENCOUNTER — Inpatient Hospital Stay: Payer: Medicare Other

## 2018-12-31 VITALS — BP 146/95 | HR 76 | Temp 98.7°F | Resp 18

## 2018-12-31 DIAGNOSIS — D649 Anemia, unspecified: Secondary | ICD-10-CM

## 2018-12-31 DIAGNOSIS — D509 Iron deficiency anemia, unspecified: Secondary | ICD-10-CM | POA: Diagnosis not present

## 2018-12-31 LAB — HEMOGLOBINOPATHY EVALUATION
HGB A2 QUANT: 2.1 % (ref 1.8–3.2)
HGB F QUANT: 0 % (ref 0.0–2.0)
HGB VARIANT: 0 %
Hgb A: 97.9 % (ref 96.4–98.8)
Hgb C: 0 %
Hgb S Quant: 0 %

## 2018-12-31 MED ORDER — SODIUM CHLORIDE 0.9 % IV SOLN
510.0000 mg | Freq: Once | INTRAVENOUS | Status: AC
  Administered 2018-12-31: 510 mg via INTRAVENOUS
  Filled 2018-12-31: qty 17

## 2018-12-31 MED ORDER — SODIUM CHLORIDE 0.9 % IV SOLN
Freq: Once | INTRAVENOUS | Status: AC
  Administered 2018-12-31: 09:00:00 via INTRAVENOUS
  Filled 2018-12-31: qty 250

## 2018-12-31 NOTE — Patient Instructions (Signed)

## 2019-01-07 ENCOUNTER — Other Ambulatory Visit: Payer: Self-pay

## 2019-01-07 ENCOUNTER — Inpatient Hospital Stay: Payer: Medicare Other

## 2019-01-07 VITALS — BP 116/70 | HR 74 | Temp 97.9°F | Resp 18

## 2019-01-07 DIAGNOSIS — D509 Iron deficiency anemia, unspecified: Secondary | ICD-10-CM | POA: Diagnosis not present

## 2019-01-07 DIAGNOSIS — D649 Anemia, unspecified: Secondary | ICD-10-CM

## 2019-01-07 MED ORDER — SODIUM CHLORIDE 0.9 % IV SOLN
510.0000 mg | Freq: Once | INTRAVENOUS | Status: AC
  Administered 2019-01-07: 510 mg via INTRAVENOUS
  Filled 2019-01-07: qty 17

## 2019-01-07 MED ORDER — SODIUM CHLORIDE 0.9 % IV SOLN
Freq: Once | INTRAVENOUS | Status: AC
  Administered 2019-01-07: 10:00:00 via INTRAVENOUS
  Filled 2019-01-07: qty 250

## 2019-01-07 NOTE — Patient Instructions (Addendum)
Coronavirus (COVID-19) Are you at risk?  Are you at risk for the Coronavirus (COVID-19)?  To be considered HIGH RISK for Coronavirus (COVID-19), you have to meet the following criteria:  . Traveled to [place], [place], [place], [place] or [place]; or in the [place] to [place], [place], [place], or [place]; and have fever, cough, and shortness of breath within the last 2 weeks of travel OR . Been in close contact with a person diagnosed with COVID-19 within the last 2 weeks and have fever, cough, and shortness of breath . IF YOU DO NOT MEET THESE CRITERIA, YOU ARE CONSIDERED LOW RISK FOR COVID-19.  What to do if you are HIGH RISK for COVID-19?  . If you are having a medical emergency, call 911. . Seek medical care right away. Before you go to a doctor's office, urgent care or emergency department, call ahead and tell them about your recent travel, contact with someone diagnosed with COVID-19, and your symptoms. You should receive instructions from your physician's office regarding next steps of care.  . When you arrive at healthcare provider, tell the healthcare staff immediately you have returned from visiting [place], [place], [place], [place] or [place]; or traveled in the [place] to [place], [place], [place], or [place]; in the last two weeks or you have been in close contact with a person diagnosed with COVID-19 in the last 2 weeks.   . Tell the health care staff about your symptoms: fever, cough and shortness of breath. . After you have been seen by a medical provider, you will be either: o Tested for (COVID-19) and discharged home on quarantine except to seek medical care if symptoms worsen, and asked to  - Stay home and avoid contact with others until you get your results (4-5 days)  - Avoid travel on public transportation if possible (such as bus, train, or airplane) or o Sent to the Emergency Department by EMS for evaluation, COVID-19 testing, and possible  admission depending on your condition and test results.  What to do if you are LOW RISK for COVID-19?  Reduce your risk of any infection by using the same precautions used for avoiding the common cold or flu:  . Wash your hands often with soap and warm water for at least 20 seconds.  If soap and water are not readily available, use an alcohol-based hand sanitizer with at least 60% alcohol.  . If coughing or sneezing, cover your mouth and nose by coughing or sneezing into the elbow areas of your shirt or coat, into a tissue or into your sleeve (not your hands). . Avoid shaking hands with others and consider head nods or verbal greetings only. . Avoid touching your eyes, nose, or mouth with unwashed hands.  . Avoid close contact with people who are sick. . Avoid places or events with large numbers of people in one location, like concerts or sporting events. . Carefully consider travel plans you have or are making. . If you are planning any travel outside or inside the [place], visit the CDC's Travelers' Health webpage for the latest health notices. . If you have some symptoms but not all symptoms, continue to monitor at home and seek medical attention if your symptoms worsen. . If you are having a medical emergency, call 911.  ADDITIONAL HEALTHCARE OPTIONS FOR PATIENTS  [facility] / e-Visit: [email or web address]         [facility]: [phone]  [facility]: [phone]                   [facility]: [phone]   Ferumoxytol injection What is this medicine? FERUMOXYTOL is an iron complex. Iron is used to make healthy red blood cells, which carry oxygen and nutrients throughout the body. This medicine is used to treat iron deficiency anemia. This medicine may be used for other purposes; ask your health care provider or pharmacist if you have questions. COMMON BRAND NAME(S): Feraheme What should I  tell my health care provider before I take this medicine? They need to know if you have any of these conditions: -anemia not caused by low iron levels -high levels of iron in the blood -magnetic resonance imaging (MRI) test scheduled -an unusual or allergic reaction to iron, other medicines, foods, dyes, or preservatives -pregnant or trying to get pregnant -breast-feeding How should I use this medicine? This medicine is for injection into a vein. It is given by a health care professional in a hospital or clinic setting. Talk to your pediatrician regarding the use of this medicine in children. Special care may be needed. Overdosage: If you think you have taken too much of this medicine contact a poison control center or emergency room at once. NOTE: This medicine is only for you. Do not share this medicine with others. What if I miss a dose? It is important not to miss your dose. Call your doctor or health care professional if you are unable to keep an appointment. What may interact with this medicine? This medicine may interact with the following medications: -other iron products This list may not describe all possible interactions. Give your health care provider a list of all the medicines, herbs, non-prescription drugs, or dietary supplements you use. Also tell them if you smoke, drink alcohol, or use illegal drugs. Some items may interact with your medicine. What should I watch for while using this medicine? Visit your doctor or healthcare professional regularly. Tell your doctor or healthcare professional if your symptoms do not start to get better or if they get worse. You may need blood work done while you are taking this medicine. You may need to follow a special diet. Talk to your doctor. Foods that contain iron include: whole grains/cereals, dried fruits, beans, or peas, leafy green vegetables, and organ meats (liver, kidney). What side effects may I notice from receiving this  medicine? Side effects that you should report to your doctor or health care professional as soon as possible: -allergic reactions like skin rash, itching or hives, swelling of the face, lips, or tongue -breathing problems -changes in blood pressure -feeling faint or lightheaded, falls -fever or chills -flushing, sweating, or hot feelings -swelling of the ankles or feet Side effects that usually do not require medical attention (report to your doctor or health care professional if they continue or are bothersome): -diarrhea -headache -nausea, vomiting -stomach pain This list may not describe all possible side effects. Call your doctor for medical advice about side effects. You may report side effects to FDA at 1-800-FDA-1088. Where should I keep my medicine? This drug is given in a hospital or clinic and will not be stored at home. NOTE: This sheet is a summary. It may not cover all possible information. If you have questions about this medicine, talk to your doctor, pharmacist, or health care provider.  2019 Elsevier/Gold Standard (2016-11-14 20:21:10)  

## 2019-05-03 ENCOUNTER — Other Ambulatory Visit: Payer: Self-pay

## 2019-05-03 ENCOUNTER — Telehealth: Payer: Self-pay

## 2019-05-03 ENCOUNTER — Other Ambulatory Visit: Payer: Self-pay | Admitting: Oncology

## 2019-05-03 ENCOUNTER — Inpatient Hospital Stay: Payer: Medicare Other

## 2019-05-03 ENCOUNTER — Inpatient Hospital Stay: Payer: Medicare Other | Attending: Oncology | Admitting: Oncology

## 2019-05-03 VITALS — BP 140/72 | HR 62 | Temp 97.8°F | Resp 17 | Ht 62.0 in | Wt 228.9 lb

## 2019-05-03 DIAGNOSIS — D500 Iron deficiency anemia secondary to blood loss (chronic): Secondary | ICD-10-CM | POA: Diagnosis present

## 2019-05-03 DIAGNOSIS — N920 Excessive and frequent menstruation with regular cycle: Secondary | ICD-10-CM | POA: Diagnosis not present

## 2019-05-03 DIAGNOSIS — D509 Iron deficiency anemia, unspecified: Secondary | ICD-10-CM

## 2019-05-03 DIAGNOSIS — D649 Anemia, unspecified: Secondary | ICD-10-CM

## 2019-05-03 LAB — FERRITIN: Ferritin: 170 ng/mL (ref 11–307)

## 2019-05-03 LAB — CBC WITH DIFFERENTIAL (CANCER CENTER ONLY)
Abs Immature Granulocytes: 0.01 10*3/uL (ref 0.00–0.07)
Basophils Absolute: 0.1 10*3/uL (ref 0.0–0.1)
Basophils Relative: 1 %
Eosinophils Absolute: 0.1 10*3/uL (ref 0.0–0.5)
Eosinophils Relative: 3 %
HCT: 37.5 % (ref 36.0–46.0)
Hemoglobin: 11.6 g/dL — ABNORMAL LOW (ref 12.0–15.0)
Immature Granulocytes: 0 %
Lymphocytes Relative: 49 %
Lymphs Abs: 2.4 10*3/uL (ref 0.7–4.0)
MCH: 26.3 pg (ref 26.0–34.0)
MCHC: 30.9 g/dL (ref 30.0–36.0)
MCV: 85 fL (ref 80.0–100.0)
Monocytes Absolute: 0.4 10*3/uL (ref 0.1–1.0)
Monocytes Relative: 7 %
Neutro Abs: 2 10*3/uL (ref 1.7–7.7)
Neutrophils Relative %: 40 %
Platelet Count: 296 10*3/uL (ref 150–400)
RBC: 4.41 MIL/uL (ref 3.87–5.11)
RDW: 15.6 % — ABNORMAL HIGH (ref 11.5–15.5)
WBC Count: 5 10*3/uL (ref 4.0–10.5)
nRBC: 0 % (ref 0.0–0.2)

## 2019-05-03 LAB — IRON AND TIBC
Iron: 61 ug/dL (ref 41–142)
Saturation Ratios: 23 % (ref 21–57)
TIBC: 260 ug/dL (ref 236–444)
UIBC: 199 ug/dL (ref 120–384)

## 2019-05-03 NOTE — Progress Notes (Signed)
Hematology and Oncology Follow Up Visit  Latasha Snyder [id number] 08-28-1976 43 y.o. 05/03/2019 10:13 AM Patient, No Pcp PerNo ref. provider found   Principle Diagnosis: 43 year old woman with iron deficiency anemia related to menstrual blood losses as well as poor iron absorption diagnosed in 2011.  Iron studies in March 2020 showed iron level of 15 and ferritin of 65.   Prior Therapy: Feraheme infusion for total 1000 mg given in March 2020.  Current therapy: Intermittent IV iron infusion as needed.  Interim History: Ms. Tsan presents today for repeat evaluation.  Since last visit, she received Feraheme infusion in March 2020 with improvement in her overall symptoms.  She denies any complications related to the IV iron infusion.  She denies any recent hospitalization or illnesses.  He denies excessive fatigue or tiredness although her fatigue level has normalized but as of late start experiencing some slight decline.  She is ambulating without any difficulties.  He denies any recent falls or syncope.  She continues to have regular menstrual cycles and no menorrhagia.  She denied any alteration mental status, neuropathy, confusion or dizziness.  Denies any headaches or lethargy.  Denies any night sweats, weight loss or changes in appetite.  Denied orthopnea, dyspnea on exertion or chest discomfort.  Denies shortness of breath, difficulty breathing hemoptysis or cough.  Denies any abdominal distention, nausea, early satiety or dyspepsia.  Denies any hematuria, frequency, dysuria or nocturia.  Denies any skin irritation, dryness or rash.  Denies any ecchymosis or petechiae.  Denies any lymphadenopathy or clotting.  Denies any heat or cold intolerance.  Denies any anxiety or depression.  Remaining review of system is negative.      Medications: I have reviewed the patient's current medications.  Current Outpatient Medications  Medication Sig Dispense Refill  . baclofen (LIORESAL) 20 MG tablet  take 1 tablet by mouth three times a day if needed  0  . diclofenac sodium (VOLTAREN) 1 % GEL APPLY ONTO SKIN 4 TIMES DAILY AS NEEDED    . HYDROcodone-acetaminophen (NORCO/VICODIN) 5-325 MG tablet take 1 tablet by mouth twice a day if needed    . meloxicam (MOBIC) 15 MG tablet Take 15 mg by mouth daily.  0  . traMADol (ULTRAM) 50 MG tablet take 1 tablet by mouth every 6 hours if needed  0   No current facility-administered medications for this visit.      Allergies: No Known Allergies  Past Medical History, Surgical history, Social history, and Family History were reviewed and updated.    Physical Exam: Blood pressure 140/72, pulse 62, temperature 97.8 F (36.6 C), temperature source Oral, resp. rate 17, height 5\' 2"  (1.575 m), weight 228 lb 14.4 oz (103.8 kg), SpO2 99 %.    ECOG:    General appearance: Comfortable appearing without any discomfort Head: Normocephalic without any trauma Oropharynx: Mucous membranes are moist and pink without any thrush or ulcers. Eyes: Pupils are equal and round reactive to light. Lymph nodes: No cervical, supraclavicular, inguinal or axillary lymphadenopathy.   Heart:regular rate and rhythm.  S1 and S2 without leg edema. Lung: Clear without any rhonchi or wheezes.  No dullness to percussion. Abdomin: Soft, nontender, nondistended with good bowel sounds.  No hepatosplenomegaly. Musculoskeletal: No joint deformity or effusion.  Full range of motion noted. Neurological: No deficits noted on motor, sensory and deep tendon reflex exam. Skin: No petechial rash or dryness.  Appeared moist.      Lab Results: Lab Results  Component Value Date  WBC 5.4 12/28/2018   HGB 9.6 (L) 12/28/2018   HCT 32.6 (L) 12/28/2018   MCV 72.8 (L) 12/28/2018   PLT 381 12/28/2018     Chemistry      Component Value Date/Time   NA 141 07/02/2016 2018   K 3.4 (L) 07/02/2016 2018   CL 99 (L) 07/02/2016 2018   CO2 21 02/04/2010 0425   BUN 12 07/02/2016 2018    CREATININE 0.70 07/02/2016 2018      Component Value Date/Time   CALCIUM 8.2 (L) 02/04/2010 0425       Impression and Plan:  43 year old woman with the following:  1.    Iron deficiency anemia diagnosed in 2011 and documented again in 2020.  Her anemia is related to chronic menstrual blood losses and poor oral iron absorption.   The natural course of her disease and treatment options in the future were reviewed.  Her work-up did not reveal any hemoglobinopathy but did document iron deficiency.  She received intravenous iron without any major complications.  Her hemoglobin has improved at this time although iron studies are pending.  Risks and benefits of repeat intravenous iron was discussed.  Potential complications including arthralgias, myalgias and rarely anaphylaxis was reiterated.  She is agreeable to receive it if she needs it at this time if not, we will continue to follow iron studies periodically.    2.  Follow-up: in 4 months for a repeat studies.   15  minutes was spent with the patient face-to-face today.  More than 50% of time was spent on reviewing her disease status, reviewing laboratory data and treatment options.Eli Hose.      Abdullahi Vallone, MD 7/24/202010:13 AM

## 2019-05-03 NOTE — Telephone Encounter (Signed)
-----   Message from Wyatt Portela, MD sent at 05/03/2019 11:39 AM EDT ----- Please let her know her iron level is normal. No iron infusion is needed now.

## 2019-05-03 NOTE — Telephone Encounter (Signed)
Called and informed patient that per Dr. Alen Blew, her iron level is normal and no iron infusion is needed now. Verbalized understanding.

## 2019-05-06 ENCOUNTER — Telehealth: Payer: Self-pay | Admitting: Oncology

## 2019-05-06 NOTE — Telephone Encounter (Signed)
Left message - called pt per 7/24 sch message - left message with appt date and time

## 2019-07-23 NOTE — Progress Notes (Addendum)
PCP - Sandi Mariscal Cardiologist -   Chest x-ray -  EKG - final ekg 07-24-19 epic Stress Test -  ECHO -  Cardiac Cath -   Sleep Study -  CPAP -   Fasting Blood Sugar -  Checks Blood Sugar _____ times a day  Blood Thinner Instructions: Aspirin Instructions: Last Dose:  Anesthesia review: OSA  Mild no cpap , abn ekg  Patient denies shortness of breath, fever, cough and chest pain at PAT appointment   Patient verbalized understanding of instructions that were given to them at the PAT appointment. Patient was also instructed that they will need to review over the PAT instructions again at home before surgery. note

## 2019-07-23 NOTE — Patient Instructions (Signed)
DUE TO COVID-19 ONLY ONE VISITOR IS ALLOWED TO COME WITH YOU AND STAY IN THE WAITING ROOM ONLY DURING PRE OP AND PROCEDURE DAY OF SURGERY. THE 1 VISITOR MAY VISIT WITH YOU AFTER SURGERY IN YOUR PRIVATE ROOM DURING VISITING HOURS ONLY!  YOU NEED TO HAVE A COVID 19 TEST ON_______ @_______ , THIS TEST MUST BE DONE BEFORE SURGERY, COME  [address] [place] , [address].  ([place]) ONCE YOUR COVID TEST IS COMPLETED, PLEASE BEGIN THE QUARANTINE INSTRUCTIONS AS OUTLINED IN YOUR HANDOUT.                YOUNG BRIM  07/23/2019   Your procedure is scheduled on: 08-01-19   Report to [facility] Main  Entrance   Report to admitting at        0830 AM     Call this number if you have problems the morning of surgery [phone]    Remember: NO SOLID FOOD AFTER MIDNIGHT THE NIGHT PRIOR TO SURGERY. NOTHING BY MOUTH EXCEPT CLEAR LIQUIDS UNTIL     0800 am  . PLEASE FINISH ENSURE DRINK PER SURGEON ORDER  WHICH NEEDS TO BE COMPLETED AT    0800 am then nothing by mouth .    CLEAR LIQUID DIET   Foods Allowed                                                                     Foods Excluded  Coffee and tea, regular and decaf                             liquids that you cannot  Plain Jell-O any favor except red or purple                                           see through such as: Fruit ices (not with fruit pulp)                                     milk, soups, orange juice  Iced Popsicles                                    All solid food Carbonated beverages, regular and diet                                    Cranberry, grape and apple juices Sports drinks like Gatorade Lightly seasoned clear broth or consume(fat free) Sugar, honey syrup   _____________________________________________________________________   BRUSH YOUR TEETH MORNING OF SURGERY AND RINSE YOUR MOUTH OUT, NO CHEWING GUM CANDY OR MINTS.     Take these medicines the morning of surgery with A  SIP OF WATER: hydrocodone if needed  You may not have any metal on your body including hair pins and              piercings  Do not wear jewelry, make-up, lotions, powders or perfumes, deodorant             Do not wear nail polish on your fingernails.  Do not shave  48 hours prior to surgery.     Do not bring valuables to the hospital. [facility] IS NOT             RESPONSIBLE   FOR VALUABLES.  Contacts, dentures or bridgework may not be worn into surgery.                   Please read over the following fact sheets you were given: _____________________________________________________________________          [facility] - Preparing for Surgery Before surgery, you can play an important role.  Because skin is not sterile, your skin needs to be as free of germs as possible.  You can reduce the number of germs on your skin by washing with CHG (chlorahexidine gluconate) soap before surgery.  CHG is an antiseptic cleaner which kills germs and bonds with the skin to continue killing germs even after washing. Please DO NOT use if you have an allergy to CHG or antibacterial soaps.  If your skin becomes reddened/irritated stop using the CHG and inform your nurse when you arrive at Short Stay. Do not shave (including legs and underarms) for at least 48 hours prior to the first CHG shower.  You may shave your face/neck. Please follow these instructions carefully:  1.  Shower with CHG Soap the night before surgery and the  morning of Surgery.  2.  If you choose to wash your hair, wash your hair first as usual with your  normal  shampoo.  3.  After you shampoo, rinse your hair and body thoroughly to remove the  shampoo.                           4.  Use CHG as you would any other liquid soap.  You can apply chg directly  to the skin and wash                       Gently with a scrungie or clean washcloth.  5.  Apply the CHG Soap to your body ONLY FROM THE NECK DOWN.    Do not use on face/ open                           Wound or open sores. Avoid contact with eyes, ears mouth and genitals (private parts).                       Wash face,  Genitals (private parts) with your normal soap.             6.  Wash thoroughly, paying special attention to the area where your surgery  will be performed.  7.  Thoroughly rinse your body with warm water from the neck down.  8.  DO NOT shower/wash with your normal soap after using and rinsing off  the CHG Soap.                9.  Pat yourself dry with a clean towel.  10.  Wear clean pajamas.            11.  Place clean sheets on your bed the night of your first shower and do not  sleep with pets. Day of Surgery : Do not apply any lotions/deodorants the morning of surgery.  Please wear clean clothes to the hospital/surgery center.  FAILURE TO FOLLOW THESE INSTRUCTIONS MAY RESULT IN THE CANCELLATION OF YOUR SURGERY PATIENT SIGNATURE_________________________________  NURSE SIGNATURE__________________________________  ________________________________________________________________________   Adam Phenix  An incentive spirometer is a tool that can help keep your lungs clear and active. This tool measures how well you are filling your lungs with each breath. Taking long deep breaths may help reverse or decrease the chance of developing breathing (pulmonary) problems (especially infection) following:  A long period of time when you are unable to move or be active. BEFORE THE PROCEDURE   If the spirometer includes an indicator to show your best effort, your nurse or respiratory therapist will set it to a desired goal.  If possible, sit up straight or lean slightly forward. Try not to slouch.  Hold the incentive spirometer in an upright position. INSTRUCTIONS FOR USE  1. Sit on the edge of your bed if possible, or sit up as far as you can in bed or on a chair. 2. Hold the incentive spirometer in an  upright position. 3. Breathe out normally. 4. Place the mouthpiece in your mouth and seal your lips tightly around it. 5. Breathe in slowly and as deeply as possible, raising the piston or the ball toward the top of the column. 6. Hold your breath for 3-5 seconds or for as long as possible. Allow the piston or ball to fall to the bottom of the column. 7. Remove the mouthpiece from your mouth and breathe out normally. 8. Rest for a few seconds and repeat Steps 1 through 7 at least 10 times every 1-2 hours when you are awake. Take your time and take a few normal breaths between deep breaths. 9. The spirometer may include an indicator to show your best effort. Use the indicator as a goal to work toward during each repetition. 10. After each set of 10 deep breaths, practice coughing to be sure your lungs are clear. If you have an incision (the cut made at the time of surgery), support your incision when coughing by placing a pillow or rolled up towels firmly against it. Once you are able to get out of bed, walk around indoors and cough well. You may stop using the incentive spirometer when instructed by your caregiver.  RISKS AND COMPLICATIONS  Take your time so you do not get dizzy or light-headed.  If you are in pain, you may need to take or ask for pain medication before doing incentive spirometry. It is harder to take a deep breath if you are having pain. AFTER USE  Rest and breathe slowly and easily.  It can be helpful to keep track of a log of your progress. Your caregiver can provide you with a simple table to help with this. If you are using the spirometer at home, follow these instructions: [place] IF:   You are having difficultly using the spirometer.  You have trouble using the spirometer as often as instructed.  Your pain medication is not giving enough relief while using the spirometer.  You develop fever of 100.5 F (38.1 C) or higher. SEEK IMMEDIATE MEDICAL CARE  IF:   You cough up bloody  sputum that had not been present before.  You develop fever of 102 F (38.9 C) or greater.  You develop worsening pain at or near the incision site. MAKE SURE YOU:   Understand these instructions.  Will watch your condition.  Will get help right away if you are not doing well or get worse. Document Released: 02/06/2007 Document Revised: 12/19/2011 Document Reviewed: 04/09/2007 Exit[employer] Patient Information 2014 [employer], [place].   ________________________________________________________________________  WHAT IS A BLOOD TRANSFUSION? Blood Transfusion Information  A transfusion is the replacement of blood or some of its parts. Blood is made up of multiple cells which provide different functions.  Red blood cells carry oxygen and are used for blood loss replacement.  White blood cells fight against infection.  Platelets control bleeding.  Plasma helps clot blood.  Other blood products are available for specialized needs, such as hemophilia or other clotting disorders. BEFORE THE TRANSFUSION  Who gives blood for transfusions?   Healthy volunteers who are fully evaluated to make sure their blood is safe. This is blood bank blood. Transfusion therapy is the safest it has ever been in the practice of medicine. Before blood is taken from a donor, a complete history is taken to make sure that person has no history of diseases nor engages in risky social behavior (examples are intravenous drug use or sexual activity with multiple partners). The donor's travel history is screened to minimize risk of transmitting infections, such as malaria. The donated blood is tested for signs of infectious diseases, such as HIV and hepatitis. The blood is then tested to be sure it is compatible with you in order to minimize the chance of a transfusion reaction. If you or a relative donates blood, this is often done in anticipation of surgery and is not appropriate for emergency  situations. It takes many days to process the donated blood. RISKS AND COMPLICATIONS Although transfusion therapy is very safe and saves many lives, the main dangers of transfusion include:   Getting an infectious disease.  Developing a transfusion reaction. This is an allergic reaction to something in the blood you were given. Every precaution is taken to prevent this. The decision to have a blood transfusion has been considered carefully by your caregiver before blood is given. Blood is not given unless the benefits outweigh the risks. AFTER THE TRANSFUSION  Right after receiving a blood transfusion, you will usually feel much better and more energetic. This is especially true if your red blood cells have gotten low (anemic). The transfusion raises the level of the red blood cells which carry oxygen, and this usually causes an energy increase.  The nurse administering the transfusion will monitor you carefully for complications. HOME CARE INSTRUCTIONS  No special instructions are needed after a transfusion. You may find your energy is better. Speak with your caregiver about any limitations on activity for underlying diseases you may have. SEEK MEDICAL CARE IF:   Your condition is not improving after your transfusion.  You develop redness or irritation at the intravenous (IV) site. SEEK IMMEDIATE MEDICAL CARE IF:  Any of the following symptoms occur over the next 12 hours:  Shaking chills.  You have a temperature by mouth above 102 F (38.9 C), not controlled by medicine.  Chest, back, or muscle pain.  People around you feel you are not acting correctly or are confused.  Shortness of breath or difficulty breathing.  Dizziness and fainting.  You get a rash or develop hives.  You have a decrease  in urine output.  Your urine turns a dark color or changes to pink, red, or brown. Any of the following symptoms occur over the next 10 days:  You have a temperature by mouth above  102 F (38.9 C), not controlled by medicine.  Shortness of breath.  Weakness after normal activity.  The white part of the eye turns yellow (jaundice).  You have a decrease in the amount of urine or are urinating less often.  Your urine turns a dark color or changes to pink, red, or brown. Document Released: 09/23/2000 Document Revised: 12/19/2011 Document Reviewed: 05/12/2008 [facility] Patient Information 2014 [place], [place].  _______________________________________________________________________

## 2019-07-24 ENCOUNTER — Other Ambulatory Visit: Payer: Self-pay

## 2019-07-24 ENCOUNTER — Encounter (HOSPITAL_COMMUNITY): Payer: Self-pay

## 2019-07-24 ENCOUNTER — Encounter (HOSPITAL_COMMUNITY)
Admission: RE | Admit: 2019-07-24 | Discharge: 2019-07-24 | Disposition: A | Discharge status: Home / Self Care | Payer: Medicare Other | Source: Ambulatory Visit | Attending: Orthopedic Surgery | Admitting: Orthopedic Surgery

## 2019-07-24 DIAGNOSIS — Z01818 Encounter for other preprocedural examination: Secondary | ICD-10-CM | POA: Diagnosis not present

## 2019-07-24 HISTORY — DX: Essential (primary) hypertension: I10

## 2019-07-24 HISTORY — DX: Anemia, unspecified: D64.9

## 2019-07-24 HISTORY — DX: Sleep apnea, unspecified: G47.30

## 2019-07-24 HISTORY — DX: Unspecified osteoarthritis, unspecified site: M19.90

## 2019-07-24 LAB — BASIC METABOLIC PANEL
Anion gap: 8 (ref 5–15)
BUN: 18 mg/dL (ref 6–20)
CO2: 28 mmol/L (ref 22–32)
Calcium: 9 mg/dL (ref 8.9–10.3)
Chloride: 101 mmol/L (ref 98–111)
Creatinine, Ser: 0.69 mg/dL (ref 0.44–1.00)
GFR calc Af Amer: >60 mL/min (ref >60)
GFR calc non Af Amer: >60 mL/min (ref >60)
Glucose, Bld: 78 mg/dL (ref 70–99)
Potassium: 4.5 mmol/L (ref 3.5–5.1)
Sodium: 137 mmol/L (ref 135–145)

## 2019-07-24 LAB — CBC
HCT: 38.1 % (ref 36.0–46.0)
Hemoglobin: 11.8 g/dL — ABNORMAL LOW (ref 12.0–15.0)
MCH: 27.2 pg (ref 26.0–34.0)
MCHC: 31 g/dL (ref 30.0–36.0)
MCV: 87.8 fL (ref 80.0–100.0)
Platelets: 309 10*3/uL (ref 150–400)
RBC: 4.34 MIL/uL (ref 3.87–5.11)
RDW: 14.9 % (ref 11.5–15.5)
WBC: 5.6 10*3/uL (ref 4.0–10.5)
nRBC: 0 % (ref 0.0–0.2)

## 2019-07-24 LAB — SURGICAL PCR SCREEN
MRSA, PCR: NEGATIVE
Staphylococcus aureus: NEGATIVE

## 2019-07-24 NOTE — H&P (View-Only) (Signed)
TOTAL KNEE REVISION ADMISSION H&P  Patient is being admitted for right revision total knee arthroplasty.  Subjective:  Chief Complaint:  Right knee pain s/p TKA  HPI: Latasha Snyder, 43 y.o. 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 The indications for the revision of the total knee arthroplasty are loosening of one or more components and progressive or substantial perporsthetic bone loss. Onset of symptoms was gradual starting 9 years ago with gradually worsening course since that time.  Prior procedures on the right knee(s) include arthroplasty.  Patient currently rates pain in the right knee(s) at 10 out of 10 with activity. There is night pain, worsening of pain with activity and weight bearing, pain that interferes with activities of daily living, pain with passive range of motion, crepitus and joint swelling.  Patient has evidence of prosthetic loosening by imaging studies. This condition presents safety issues increasing the risk of falls.  There is no current active infection.  Risks, benefits and expectations were discussed with the patient.  Risks including but not limited to the risk of anesthesia, blood clots, nerve damage, blood vessel damage, failure of the prosthesis, infection and up to and including death.  Patient understand the risks, benefits and expectations and wishes to proceed with surgery.   PCP: Sandi Mariscal, MD  D/C Plans:       Home  Post-op Meds:       No Rx given   Tranexamic Acid:      To be given - IV   Decadron:      Is to be given  FYI:      ASA  Oxycodone  DME:   Pt equipment Rxs sent  PT:   OPPT   Pharmacy: [facility], [place], [place]  [id number]   Patient Active Problem List   Diagnosis Date Noted  . Anemia 12/28/2018  . OSA (obstructive sleep apnea) 09/11/2017  . Hypertension, essential 09/11/2017   Past Medical History:  Diagnosis Date  . Anemia   . Arthritis   . Hypertension   . Morbid obesity ([place])   . Sleep apnea    mild no cpap and weight loss    Past Surgical History:  Procedure Laterality Date  . CHOLECYSTECTOMY    . KNEE SURGERY    . [place] RESECTION     06-2018  . TOTAL KNEE ARTHROPLASTY Bilateral   . TUBAL LIGATION      No current facility-administered medications for this encounter.    Current Outpatient Medications  Medication Sig Dispense Refill Last Dose  . baclofen (LIORESAL) 20 MG tablet Take 20 mg by mouth 3 (three) times daily as needed for muscle spasms.   0   . cholecalciferol (VITAMIN D3) 25 MCG (1000 UT) tablet Take 1,000 Units by mouth 4 (four) times a week.     Marland Kitchen HYDROcodone-acetaminophen (NORCO) 7.5-325 MG tablet Take 1 tablet by mouth 4 (four) times daily.      . meloxicam (MOBIC) 15 MG tablet Take 15 mg by mouth daily.  0   . vitamin B-12 (CYANOCOBALAMIN) 1000 MCG tablet Take 1,000 mcg by mouth 4 (four) times a week.      No Known Allergies   Social History   Tobacco Use  .  Smoking status: Former Smoker    Years: 10.00  . Smokeless tobacco: Never Used  Substance Use Topics  . Alcohol use: Yes    Comment: socially       Review of Systems  Constitutional: Negative.   HENT: Negative.   Eyes: Negative.   Respiratory: Negative.   Cardiovascular: Negative.   Gastrointestinal: Negative.   Genitourinary: Negative.   Musculoskeletal: Positive for joint pain.  Skin: Negative.   Neurological: Negative.   Endo/Heme/Allergies: Negative.   Psychiatric/Behavioral: Negative.      Objective:  Physical Exam  Constitutional: She is oriented to person, place, and time. She appears well-developed.  HENT:  Head: Normocephalic.  Eyes: Pupils are equal,  round, and reactive to light.  Neck: Neck supple. No JVD present. No tracheal deviation present. No thyromegaly present.  Cardiovascular: Normal rate, regular rhythm and intact distal pulses.  Respiratory: Effort normal and breath sounds normal. No respiratory distress. She has no wheezes.  GI: Soft. There is no abdominal tenderness. There is no guarding.  Musculoskeletal:     Right knee: She exhibits decreased range of motion, swelling and bony tenderness. She exhibits no ecchymosis, no deformity, no laceration and no erythema. Tenderness found.  Lymphadenopathy:    She has no cervical adenopathy.  Neurological: She is alert and oriented to person, place, and time.  Skin: Skin is warm and dry.  Psychiatric: She has a normal mood and affect.    Vital signs in last 24 hours: Temp:  [98.4 F (36.9 C)] 98.4 F (36.9 C) (10/14 1307) Pulse Rate:  [60] 60 (10/14 1307) Resp:  [18] 18 (10/14 1307) BP: (123)/(81) 123/81 (10/14 1307) Weight:  [103.2 kg] 103.2 kg (10/14 1307)  Labs:  Estimated body mass index is 42.99 kg/m as calculated from the following:   Height as of 07/24/19: 5\' 1"  (1.549 m).   Weight as of 07/24/19: 103.2 kg.  Imaging Review Plain radiographs demonstrate loosening of the right knee. There is evidence of loosening of the components. The bone quality appears to be good for age and reported activity level.     Assessment/Plan:  Right knee with failed previous arthroplasty.   The patient history, physical examination, clinical judgment of the provider and imaging studies are consistent with looseing of the right knee(s), previous total knee arthroplasty. Revision total knee arthroplasty is deemed medically necessary. The treatment options including medical management, injection therapy, arthroscopy and revision arthroplasty were discussed at length. The risks and benefits of revision total knee arthroplasty were presented and reviewed. The risks due to aseptic  loosening, infection, stiffness, patella tracking problems, thromboembolic complications and other imponderables were discussed. The patient acknowledged the explanation, agreed to proceed with the plan and consent was signed. Patient is being admitted for inpatient treatment for surgery, pain control, PT, OT, prophylactic antibiotics, VTE prophylaxis, progressive ambulation and ADL's and discharge planning.The patient is planning to be discharged home.     07/26/19 Latasha Stapel   PA-C  07/24/2019, 2:09 PM

## 2019-07-24 NOTE — H&P (Signed)
TOTAL KNEE REVISION ADMISSION H&P  Patient is being admitted for right revision total knee arthroplasty.  Subjective:  Chief Complaint:  Right knee pain s/p TKA  HPI: Latasha Snyder, 43 y.o. 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 The indications for the revision of the total knee arthroplasty are loosening of one or more components and progressive or substantial perporsthetic bone loss. Onset of symptoms was gradual starting 9 years ago with gradually worsening course since that time.  Prior procedures on the right knee(s) include arthroplasty.  Patient currently rates pain in the right knee(s) at 10 out of 10 with activity. There is night pain, worsening of pain with activity and weight bearing, pain that interferes with activities of daily living, pain with passive range of motion, crepitus and joint swelling.  Patient has evidence of prosthetic loosening by imaging studies. This condition presents safety issues increasing the risk of falls.  There is no current active infection.  Risks, benefits and expectations were discussed with the patient.  Risks including but not limited to the risk of anesthesia, blood clots, nerve damage, blood vessel damage, failure of the prosthesis, infection and up to and including death.  Patient understand the risks, benefits and expectations and wishes to proceed with surgery.   PCP: Sandi Mariscal, MD  D/C Plans:       Home  Post-op Meds:       No Rx given   Tranexamic Acid:      To be given - IV   Decadron:      Is to be given  FYI:      ASA  Oxycodone  DME:   Pt equipment Rxs sent  PT:   OPPT   Pharmacy: [facility], [place], [place]  [id number]   Patient Active Problem List   Diagnosis Date Noted  . Anemia 12/28/2018  . OSA (obstructive sleep apnea) 09/11/2017  . Hypertension, essential 09/11/2017   Past Medical History:  Diagnosis Date  . Anemia   . Arthritis   . Hypertension   . Morbid obesity ([place])   . Sleep apnea    mild no cpap and weight loss    Past Surgical History:  Procedure Laterality Date  . CHOLECYSTECTOMY    . KNEE SURGERY    . [place] RESECTION     06-2018  . TOTAL KNEE ARTHROPLASTY Bilateral   . TUBAL LIGATION      No current facility-administered medications for this encounter.    Current Outpatient Medications  Medication Sig Dispense Refill Last Dose  . baclofen (LIORESAL) 20 MG tablet Take 20 mg by mouth 3 (three) times daily as needed for muscle spasms.   0   . cholecalciferol (VITAMIN D3) 25 MCG (1000 UT) tablet Take 1,000 Units by mouth 4 (four) times a week.     Marland Kitchen HYDROcodone-acetaminophen (NORCO) 7.5-325 MG tablet Take 1 tablet by mouth 4 (four) times daily.      . meloxicam (MOBIC) 15 MG tablet Take 15 mg by mouth daily.  0   . vitamin B-12 (CYANOCOBALAMIN) 1000 MCG tablet Take 1,000 mcg by mouth 4 (four) times a week.      No Known Allergies   Social History   Tobacco Use  .  Smoking status: Former Smoker    Years: 10.00  . Smokeless tobacco: Never Used  Substance Use Topics  . Alcohol use: Yes    Comment: socially       Review of Systems  Constitutional: Negative.   HENT: Negative.   Eyes: Negative.   Respiratory: Negative.   Cardiovascular: Negative.   Gastrointestinal: Negative.   Genitourinary: Negative.   Musculoskeletal: Positive for joint pain.  Skin: Negative.   Neurological: Negative.   Endo/Heme/Allergies: Negative.   Psychiatric/Behavioral: Negative.      Objective:  Physical Exam  Constitutional: She is oriented to person, place, and time. She appears well-developed.  HENT:  Head: Normocephalic.  Eyes: Pupils are equal,  round, and reactive to light.  Neck: Neck supple. No JVD present. No tracheal deviation present. No thyromegaly present.  Cardiovascular: Normal rate, regular rhythm and intact distal pulses.  Respiratory: Effort normal and breath sounds normal. No respiratory distress. She has no wheezes.  GI: Soft. There is no abdominal tenderness. There is no guarding.  Musculoskeletal:     Right knee: She exhibits decreased range of motion, swelling and bony tenderness. She exhibits no ecchymosis, no deformity, no laceration and no erythema. Tenderness found.  Lymphadenopathy:    She has no cervical adenopathy.  Neurological: She is alert and oriented to person, place, and time.  Skin: Skin is warm and dry.  Psychiatric: She has a normal mood and affect.    Vital signs in last 24 hours: Temp:  [98.4 F (36.9 C)] 98.4 F (36.9 C) (10/14 1307) Pulse Rate:  [60] 60 (10/14 1307) Resp:  [18] 18 (10/14 1307) BP: (123)/(81) 123/81 (10/14 1307) Weight:  [103.2 kg] 103.2 kg (10/14 1307)  Labs:  Estimated body mass index is 42.99 kg/m as calculated from the following:   Height as of 07/24/19: 5' 1" (1.549 m).   Weight as of 07/24/19: 103.2 kg.  Imaging Review Plain radiographs demonstrate loosening of the right knee. There is evidence of loosening of the components. The bone quality appears to be good for age and reported activity level.     Assessment/Plan:  Right knee with failed previous arthroplasty.   The patient history, physical examination, clinical judgment of the provider and imaging studies are consistent with looseing of the right knee(s), previous total knee arthroplasty. Revision total knee arthroplasty is deemed medically necessary. The treatment options including medical management, injection therapy, arthroscopy and revision arthroplasty were discussed at length. The risks and benefits of revision total knee arthroplasty were presented and reviewed. The risks due to aseptic  loosening, infection, stiffness, patella tracking problems, thromboembolic complications and other imponderables were discussed. The patient acknowledged the explanation, agreed to proceed with the plan and consent was signed. Patient is being admitted for inpatient treatment for surgery, pain control, PT, OT, prophylactic antibiotics, VTE prophylaxis, progressive ambulation and ADL's and discharge planning.The patient is planning to be discharged home.     Lourene Hoston S. Agueda Houpt   PA-C  07/24/2019, 2:09 PM  

## 2019-07-29 ENCOUNTER — Other Ambulatory Visit (HOSPITAL_COMMUNITY)
Admission: RE | Admit: 2019-07-29 | Discharge: 2019-07-29 | Disposition: A | Discharge status: Home / Self Care | Payer: Medicare Other | Source: Ambulatory Visit | Attending: Orthopedic Surgery | Admitting: Orthopedic Surgery

## 2019-07-29 DIAGNOSIS — Z01812 Encounter for preprocedural laboratory examination: Secondary | ICD-10-CM | POA: Insufficient documentation

## 2019-07-29 DIAGNOSIS — Z20828 Contact with and (suspected) exposure to other viral communicable diseases: Secondary | ICD-10-CM | POA: Insufficient documentation

## 2019-07-30 LAB — NOVEL CORONAVIRUS, NAA (HOSP ORDER, SEND-OUT TO REF LAB; TAT 18-24 HRS): SARS-CoV-2, NAA: NOT DETECTED

## 2019-08-01 ENCOUNTER — Encounter (HOSPITAL_COMMUNITY): Payer: Self-pay | Admitting: Registered Nurse

## 2019-08-01 ENCOUNTER — Other Ambulatory Visit: Payer: Self-pay

## 2019-08-01 ENCOUNTER — Inpatient Hospital Stay (HOSPITAL_COMMUNITY): Payer: Medicare Other | Admitting: Physician Assistant

## 2019-08-01 ENCOUNTER — Inpatient Hospital Stay (HOSPITAL_COMMUNITY): Payer: Medicare Other | Admitting: Anesthesiology

## 2019-08-01 ENCOUNTER — Encounter (HOSPITAL_COMMUNITY)
Admission: RE | Disposition: A | Discharge status: Home Health | Payer: Self-pay | Source: Other Acute Inpatient Hospital | Attending: Orthopedic Surgery

## 2019-08-01 ENCOUNTER — Inpatient Hospital Stay (HOSPITAL_COMMUNITY): Admission: RE | Admit: 2019-08-01 | Payer: Medicare Other | Source: Home / Self Care | Admitting: Orthopedic Surgery

## 2019-08-01 ENCOUNTER — Inpatient Hospital Stay (HOSPITAL_COMMUNITY)
Admission: RE | Admit: 2019-08-01 | Discharge: 2019-08-04 | DRG: 467 | Disposition: A | Discharge status: Home Health | Payer: Medicare Other | Source: Other Acute Inpatient Hospital | Attending: Orthopedic Surgery | Admitting: Orthopedic Surgery

## 2019-08-01 DIAGNOSIS — Z87891 Personal history of nicotine dependence: Secondary | ICD-10-CM

## 2019-08-01 DIAGNOSIS — M92511 Juvenile osteochondrosis of proximal tibia, right leg: Secondary | ICD-10-CM | POA: Diagnosis present

## 2019-08-01 DIAGNOSIS — Y792 Prosthetic and other implants, materials and accessory orthopedic devices associated with adverse incidents: Secondary | ICD-10-CM | POA: Diagnosis present

## 2019-08-01 DIAGNOSIS — M2540 Effusion, unspecified joint: Secondary | ICD-10-CM | POA: Diagnosis present

## 2019-08-01 DIAGNOSIS — Z79899 Other long term (current) drug therapy: Secondary | ICD-10-CM | POA: Diagnosis not present

## 2019-08-01 DIAGNOSIS — G4733 Obstructive sleep apnea (adult) (pediatric): Secondary | ICD-10-CM | POA: Diagnosis present

## 2019-08-01 DIAGNOSIS — Z96652 Presence of left artificial knee joint: Secondary | ICD-10-CM | POA: Diagnosis present

## 2019-08-01 DIAGNOSIS — D649 Anemia, unspecified: Secondary | ICD-10-CM | POA: Diagnosis present

## 2019-08-01 DIAGNOSIS — Z20828 Contact with and (suspected) exposure to other viral communicable diseases: Secondary | ICD-10-CM | POA: Diagnosis present

## 2019-08-01 DIAGNOSIS — T84092A Other mechanical complication of internal right knee prosthesis, initial encounter: Secondary | ICD-10-CM | POA: Diagnosis present

## 2019-08-01 DIAGNOSIS — M25369 Other instability, unspecified knee: Secondary | ICD-10-CM | POA: Diagnosis present

## 2019-08-01 DIAGNOSIS — Z6841 Body Mass Index (BMI) 40.0 and over, adult: Secondary | ICD-10-CM | POA: Diagnosis not present

## 2019-08-01 DIAGNOSIS — Z791 Long term (current) use of non-steroidal anti-inflammatories (NSAID): Secondary | ICD-10-CM

## 2019-08-01 DIAGNOSIS — M1990 Unspecified osteoarthritis, unspecified site: Secondary | ICD-10-CM | POA: Diagnosis present

## 2019-08-01 DIAGNOSIS — I10 Essential (primary) hypertension: Secondary | ICD-10-CM | POA: Diagnosis present

## 2019-08-01 DIAGNOSIS — Z96651 Presence of right artificial knee joint: Secondary | ICD-10-CM

## 2019-08-01 HISTORY — PX: TOTAL KNEE REVISION: SHX996

## 2019-08-01 LAB — TYPE AND SCREEN
ABO/RH(D): AB POS
Antibody Screen: NEGATIVE

## 2019-08-01 LAB — PREGNANCY, URINE: Preg Test, Ur: NEGATIVE

## 2019-08-01 SURGERY — TOTAL KNEE REVISION
Anesthesia: Spinal | Site: Knee | Laterality: Right

## 2019-08-01 MED ORDER — PROPOFOL 500 MG/50ML IV EMUL
INTRAVENOUS | Status: AC
  Filled 2019-08-01: qty 50

## 2019-08-01 MED ORDER — PROPOFOL 10 MG/ML IV BOLUS
INTRAVENOUS | Status: DC | PRN
  Administered 2019-08-01: 10 mg via INTRAVENOUS
  Administered 2019-08-01: 20 mg via INTRAVENOUS
  Administered 2019-08-01: 10 mg via INTRAVENOUS

## 2019-08-01 MED ORDER — POVIDONE-IODINE 10 % EX SWAB
2.0000 "application " | Freq: Once | CUTANEOUS | Status: AC
  Administered 2019-08-01: 2 via TOPICAL

## 2019-08-01 MED ORDER — BISACODYL 10 MG RE SUPP: 10.0000 mg | Freq: Every day | RECTAL | Status: DC | PRN

## 2019-08-01 MED ORDER — CEFAZOLIN SODIUM-DEXTROSE 2-4 GM/100ML-% IV SOLN
2.0000 g | INTRAVENOUS | Status: AC
  Administered 2019-08-01: 2 g via INTRAVENOUS
  Filled 2019-08-01: qty 100

## 2019-08-01 MED ORDER — DEXAMETHASONE SODIUM PHOSPHATE 10 MG/ML IJ SOLN
INTRAMUSCULAR | Status: AC
  Filled 2019-08-01: qty 1

## 2019-08-01 MED ORDER — METOCLOPRAMIDE HCL 5 MG PO TABS: 5.0000 mg | ORAL_TABLET | Freq: Three times a day (TID) | ORAL | Status: DC | PRN

## 2019-08-01 MED ORDER — LIDOCAINE 2% (20 MG/ML) 5 ML SYRINGE
INTRAMUSCULAR | Status: AC
  Filled 2019-08-01: qty 5

## 2019-08-01 MED ORDER — MENTHOL 3 MG MT LOZG: 1.0000 | LOZENGE | OROMUCOSAL | Status: DC | PRN

## 2019-08-01 MED ORDER — SODIUM CHLORIDE (PF) 0.9 % IJ SOLN
INTRAMUSCULAR | Status: DC | PRN
  Administered 2019-08-01: 30 mL

## 2019-08-01 MED ORDER — LACTATED RINGERS IV SOLN
INTRAVENOUS | Status: DC
  Administered 2019-08-01 (×2): via INTRAVENOUS

## 2019-08-01 MED ORDER — SODIUM CHLORIDE 0.9 % IV SOLN
INTRAVENOUS | Status: DC
  Administered 2019-08-01: 19:00:00 via INTRAVENOUS

## 2019-08-01 MED ORDER — HYDROMORPHONE HCL 1 MG/ML IJ SOLN: 0.2500 mg | INTRAMUSCULAR | Status: DC | PRN

## 2019-08-01 MED ORDER — POLYETHYLENE GLYCOL 3350 17 G PO PACK
17.0000 g | PACK | Freq: Two times a day (BID) | ORAL | Status: DC
  Administered 2019-08-01 – 2019-08-04 (×4): 17 g via ORAL
  Filled 2019-08-01 (×6): qty 1

## 2019-08-01 MED ORDER — SODIUM CHLORIDE 0.9 % IR SOLN
Status: DC | PRN
  Administered 2019-08-01: 3000 mL

## 2019-08-01 MED ORDER — OXYCODONE HCL 5 MG PO TABS
10.0000 mg | ORAL_TABLET | ORAL | Status: DC | PRN
  Administered 2019-08-01 – 2019-08-02 (×3): 15 mg via ORAL
  Filled 2019-08-01 (×3): qty 3

## 2019-08-01 MED ORDER — PROPOFOL 500 MG/50ML IV EMUL
INTRAVENOUS | Status: DC | PRN
  Administered 2019-08-01: 75 ug/kg/min via INTRAVENOUS

## 2019-08-01 MED ORDER — DIPHENHYDRAMINE HCL 12.5 MG/5ML PO ELIX: 12.5000 mg | ORAL_SOLUTION | ORAL | Status: DC | PRN

## 2019-08-01 MED ORDER — LIDOCAINE 2% (20 MG/ML) 5 ML SYRINGE
INTRAMUSCULAR | Status: DC | PRN
  Administered 2019-08-01: 40 mg via INTRAVENOUS

## 2019-08-01 MED ORDER — METHOCARBAMOL 500 MG PO TABS
500.0000 mg | ORAL_TABLET | Freq: Four times a day (QID) | ORAL | Status: DC | PRN
  Administered 2019-08-01 – 2019-08-04 (×8): 500 mg via ORAL
  Filled 2019-08-01 (×9): qty 1

## 2019-08-01 MED ORDER — ONDANSETRON HCL 4 MG PO TABS: 4.0000 mg | ORAL_TABLET | Freq: Four times a day (QID) | ORAL | Status: DC | PRN

## 2019-08-01 MED ORDER — ONDANSETRON HCL 4 MG/2ML IJ SOLN: 4.0000 mg | Freq: Four times a day (QID) | INTRAMUSCULAR | Status: DC | PRN

## 2019-08-01 MED ORDER — BUPIVACAINE-EPINEPHRINE (PF) 0.5% -1:200000 IJ SOLN
INTRAMUSCULAR | Status: DC | PRN
  Administered 2019-08-01: 20 mL via PERINEURAL

## 2019-08-01 MED ORDER — METHOCARBAMOL 500 MG IVPB - SIMPLE MED
500.0000 mg | Freq: Four times a day (QID) | INTRAVENOUS | Status: DC | PRN
  Administered 2019-08-01: 16:00:00 500 mg via INTRAVENOUS
  Filled 2019-08-01: qty 50

## 2019-08-01 MED ORDER — BUPIVACAINE HCL (PF) 0.25 % IJ SOLN
INTRAMUSCULAR | Status: DC | PRN
  Administered 2019-08-01: 30 mL via INTRA_ARTICULAR

## 2019-08-01 MED ORDER — PHENOL 1.4 % MT LIQD: 1.0000 | OROMUCOSAL | Status: DC | PRN

## 2019-08-01 MED ORDER — ONDANSETRON HCL 4 MG/2ML IJ SOLN
INTRAMUSCULAR | Status: AC
  Filled 2019-08-01: qty 2

## 2019-08-01 MED ORDER — HYDROMORPHONE HCL 1 MG/ML IJ SOLN
0.5000 mg | INTRAMUSCULAR | Status: DC | PRN
  Administered 2019-08-01: 0.5 mg via INTRAVENOUS
  Administered 2019-08-02 – 2019-08-03 (×5): 1 mg via INTRAVENOUS
  Filled 2019-08-01 (×8): qty 1

## 2019-08-01 MED ORDER — TRANEXAMIC ACID-NACL 1000-0.7 MG/100ML-% IV SOLN
1000.0000 mg | Freq: Once | INTRAVENOUS | Status: AC
  Administered 2019-08-01: 1000 mg via INTRAVENOUS
  Filled 2019-08-01: qty 100

## 2019-08-01 MED ORDER — CHLORHEXIDINE GLUCONATE 4 % EX LIQD: 60.0000 mL | Freq: Once | CUTANEOUS | Status: DC

## 2019-08-01 MED ORDER — METHOCARBAMOL 500 MG IVPB - SIMPLE MED
INTRAVENOUS | Status: AC
  Filled 2019-08-01: qty 50

## 2019-08-01 MED ORDER — ALUM & MAG HYDROXIDE-SIMETH 200-200-20 MG/5ML PO SUSP: 15.0000 mL | ORAL | Status: DC | PRN

## 2019-08-01 MED ORDER — BUPIVACAINE IN DEXTROSE 0.75-8.25 % IT SOLN
INTRATHECAL | Status: DC | PRN
  Administered 2019-08-01: 2 mL via INTRATHECAL

## 2019-08-01 MED ORDER — EPHEDRINE SULFATE-NACL 50-0.9 MG/10ML-% IV SOSY
PREFILLED_SYRINGE | INTRAVENOUS | Status: DC | PRN
  Administered 2019-08-01 (×2): 10 mg via INTRAVENOUS

## 2019-08-01 MED ORDER — OXYCODONE HCL 5 MG PO TABS
5.0000 mg | ORAL_TABLET | ORAL | Status: DC | PRN
  Administered 2019-08-01: 10 mg via ORAL
  Filled 2019-08-01: qty 2

## 2019-08-01 MED ORDER — METOCLOPRAMIDE HCL 5 MG/ML IJ SOLN: 5.0000 mg | Freq: Three times a day (TID) | INTRAMUSCULAR | Status: DC | PRN

## 2019-08-01 MED ORDER — 0.9 % SODIUM CHLORIDE (POUR BTL) OPTIME
TOPICAL | Status: DC | PRN
  Administered 2019-08-01: 1000 mL

## 2019-08-01 MED ORDER — MAGNESIUM CITRATE PO SOLN: 1.0000 | Freq: Once | ORAL | Status: DC | PRN

## 2019-08-01 MED ORDER — KETOROLAC TROMETHAMINE 30 MG/ML IJ SOLN
INTRAMUSCULAR | Status: DC | PRN
  Administered 2019-08-01: 30 mg via INTRA_ARTICULAR

## 2019-08-01 MED ORDER — ASPIRIN 81 MG PO CHEW
81.0000 mg | CHEWABLE_TABLET | Freq: Two times a day (BID) | ORAL | Status: DC
  Administered 2019-08-01 – 2019-08-04 (×6): 81 mg via ORAL
  Filled 2019-08-01 (×7): qty 1

## 2019-08-01 MED ORDER — MIDAZOLAM HCL 2 MG/2ML IJ SOLN
1.0000 mg | Freq: Once | INTRAMUSCULAR | Status: AC
  Administered 2019-08-01: 2 mg via INTRAVENOUS
  Filled 2019-08-01: qty 2

## 2019-08-01 MED ORDER — TRANEXAMIC ACID-NACL 1000-0.7 MG/100ML-% IV SOLN
1000.0000 mg | INTRAVENOUS | Status: AC
  Administered 2019-08-01: 1000 mg via INTRAVENOUS
  Filled 2019-08-01: qty 100

## 2019-08-01 MED ORDER — DOCUSATE SODIUM 100 MG PO CAPS
100.0000 mg | ORAL_CAPSULE | Freq: Two times a day (BID) | ORAL | Status: DC
  Administered 2019-08-01 – 2019-08-04 (×6): 100 mg via ORAL
  Filled 2019-08-01 (×7): qty 1

## 2019-08-01 MED ORDER — STERILE WATER FOR IRRIGATION IR SOLN
Status: DC | PRN
  Administered 2019-08-01: 2000 mL

## 2019-08-01 MED ORDER — CELECOXIB 200 MG PO CAPS
200.0000 mg | ORAL_CAPSULE | Freq: Two times a day (BID) | ORAL | Status: DC
  Administered 2019-08-01 – 2019-08-04 (×6): 200 mg via ORAL
  Filled 2019-08-01 (×7): qty 1

## 2019-08-01 MED ORDER — FENTANYL CITRATE (PF) 100 MCG/2ML IJ SOLN
25.0000 ug | Freq: Once | INTRAMUSCULAR | Status: AC
  Administered 2019-08-01: 100 ug via INTRAVENOUS
  Filled 2019-08-01: qty 2

## 2019-08-01 MED ORDER — DEXAMETHASONE SODIUM PHOSPHATE 10 MG/ML IJ SOLN
10.0000 mg | Freq: Once | INTRAMUSCULAR | Status: DC
  Filled 2019-08-01: qty 1

## 2019-08-01 MED ORDER — ACETAMINOPHEN 325 MG PO TABS: 325.0000 mg | ORAL_TABLET | Freq: Four times a day (QID) | ORAL | Status: DC | PRN

## 2019-08-01 MED ORDER — FERROUS SULFATE 325 (65 FE) MG PO TABS
325.0000 mg | ORAL_TABLET | Freq: Two times a day (BID) | ORAL | Status: DC
  Administered 2019-08-02 – 2019-08-04 (×5): 325 mg via ORAL
  Filled 2019-08-01 (×5): qty 1

## 2019-08-01 MED ORDER — ONDANSETRON HCL 4 MG/2ML IJ SOLN
INTRAMUSCULAR | Status: DC | PRN
  Administered 2019-08-01: 4 mg via INTRAVENOUS

## 2019-08-01 MED ORDER — CEFAZOLIN SODIUM-DEXTROSE 2-4 GM/100ML-% IV SOLN
2.0000 g | Freq: Four times a day (QID) | INTRAVENOUS | Status: AC
  Administered 2019-08-01 – 2019-08-02 (×2): 2 g via INTRAVENOUS
  Filled 2019-08-01 (×2): qty 100

## 2019-08-01 MED ORDER — DEXAMETHASONE SODIUM PHOSPHATE 10 MG/ML IJ SOLN: 10.0000 mg | Freq: Once | INTRAMUSCULAR | Status: DC

## 2019-08-01 MED ORDER — DEXAMETHASONE SODIUM PHOSPHATE 10 MG/ML IJ SOLN
INTRAMUSCULAR | Status: DC | PRN
  Administered 2019-08-01: 10 mg via INTRAVENOUS

## 2019-08-01 SURGICAL SUPPLY — 80 items
ADH SKN CLS APL DERMABOND .7 (GAUZE/BANDAGES/DRESSINGS) ×1
ATTUNE MED ANAT PAT 35 KNEE (Knees) ×1 IMPLANT
AUG FEM SZ4 12 REV DIST STRL (Miscellaneous) ×1 IMPLANT
AUG FEM SZ4 8 REV DIST STRL LF (Miscellaneous) ×2 IMPLANT
AUG FEM SZ4 8 REV POST STRL LF (Miscellaneous) ×1 IMPLANT
AUGMENT DISTAL FEM SZ4 12 HIP (Miscellaneous) ×1 IMPLANT
AUGMENT DISTAL FEM SZ4 8 HIP (Miscellaneous) ×2 IMPLANT
AUGMENT POST FEM SZ4 8 HIP (Miscellaneous) ×1 IMPLANT
BAG DECANTER FOR FLEXI CONT (MISCELLANEOUS) IMPLANT
BAG SPEC THK2 15X12 ZIP CLS (MISCELLANEOUS) ×1
BAG ZIPLOCK 12X15 (MISCELLANEOUS) ×1 IMPLANT
BLADE SAW SGTL 11.0X1.19X90.0M (BLADE) IMPLANT
BLADE SAW SGTL 13.0X1.19X90.0M (BLADE) ×2 IMPLANT
BLADE SAW SGTL 81X20 HD (BLADE) ×2 IMPLANT
BLADE SURG SZ10 CARB STEEL (BLADE) ×4 IMPLANT
BNDG ELASTIC 6X5.8 VLCR STR LF (GAUZE/BANDAGES/DRESSINGS) ×2 IMPLANT
BRUSH FEMORAL CANAL (MISCELLANEOUS) IMPLANT
BSPLAT TIB 4 CMNT REV ROT PLAT (Knees) ×1 IMPLANT
CEMENT HV SMART SET (Cement) ×4 IMPLANT
COMP FEM ATTUNE CRS CEM RT SZ4 (Femur) ×2 IMPLANT
COMPONENT FEM ATN CR CEM RTSZ4 (Femur) IMPLANT
COVER SURGICAL LIGHT HANDLE (MISCELLANEOUS) ×2 IMPLANT
COVER WAND RF STERILE (DRAPES) IMPLANT
CUFF TOURN SGL QUICK 34 (TOURNIQUET CUFF) ×2
CUFF TRNQT CYL 34X4.125X (TOURNIQUET CUFF) ×1 IMPLANT
DECANTER SPIKE VIAL GLASS SM (MISCELLANEOUS) ×2 IMPLANT
DERMABOND ADVANCED (GAUZE/BANDAGES/DRESSINGS) ×1
DERMABOND ADVANCED .7 DNX12 (GAUZE/BANDAGES/DRESSINGS) ×1 IMPLANT
DRAPE U-SHAPE 47X51 STRL (DRAPES) ×2 IMPLANT
DRESSING AQUACEL AG SP 3.5X10 (GAUZE/BANDAGES/DRESSINGS) IMPLANT
DRSG AQUACEL AG ADV 3.5X10 (GAUZE/BANDAGES/DRESSINGS) ×2 IMPLANT
DRSG AQUACEL AG ADV 3.5X14 (GAUZE/BANDAGES/DRESSINGS) ×1 IMPLANT
DRSG AQUACEL AG SP 3.5X10 (GAUZE/BANDAGES/DRESSINGS)
DURAPREP 26ML APPLICATOR (WOUND CARE) ×4 IMPLANT
ELECT REM PT RETURN 15FT ADLT (MISCELLANEOUS) ×2 IMPLANT
GAUZE SPONGE 2X2 8PLY STRL LF (GAUZE/BANDAGES/DRESSINGS) IMPLANT
GLOVE BIOGEL PI IND STRL 7.5 (GLOVE) ×1 IMPLANT
GLOVE BIOGEL PI IND STRL 8.5 (GLOVE) ×1 IMPLANT
GLOVE BIOGEL PI INDICATOR 7.5 (GLOVE) ×1
GLOVE BIOGEL PI INDICATOR 8.5 (GLOVE) ×1
GLOVE ECLIPSE 8.0 STRL XLNG CF (GLOVE) ×4 IMPLANT
GLOVE ORTHO TXT STRL SZ7.5 (GLOVE) ×3 IMPLANT
GOWN STRL REUS W/TWL 2XL LVL3 (GOWN DISPOSABLE) ×2 IMPLANT
GOWN STRL REUS W/TWL LRG LVL3 (GOWN DISPOSABLE) ×4 IMPLANT
GOWN STRL REUS W/TWL XL LVL3 (GOWN DISPOSABLE) ×1 IMPLANT
HANDPIECE INTERPULSE COAX TIP (DISPOSABLE) ×2
HOLDER FOLEY CATH W/STRAP (MISCELLANEOUS) ×1 IMPLANT
INSERT ROTATE PLTFM SZ4 14 HIP (Miscellaneous) ×1 IMPLANT
INSERT TIB CMT ATTUNE RP SZ4 (Knees) ×1 IMPLANT
KIT TURNOVER KIT A (KITS) IMPLANT
MANIFOLD NEPTUNE II (INSTRUMENTS) ×2 IMPLANT
NDL SAFETY ECLIPSE 18X1.5 (NEEDLE) IMPLANT
NEEDLE HYPO 18GX1.5 SHARP (NEEDLE) ×2
NS IRRIG 1000ML POUR BTL (IV SOLUTION) ×2 IMPLANT
PACK TOTAL KNEE CUSTOM (KITS) ×2 IMPLANT
PIN FIX SIGMA LCS THRD HI (PIN) ×1 IMPLANT
PROTECTOR NERVE ULNAR (MISCELLANEOUS) ×2 IMPLANT
RESTRICTOR CEMENT SZ 5 C-STEM (Cement) ×1 IMPLANT
SET HNDPC FAN SPRY TIP SCT (DISPOSABLE) ×1 IMPLANT
SET PAD KNEE POSITIONER (MISCELLANEOUS) ×2 IMPLANT
SLEEVE TIB ATTUNE FP 61 (Knees) ×1 IMPLANT
SPONGE GAUZE 2X2 STER 10/PKG (GAUZE/BANDAGES/DRESSINGS)
SPONGE LAP 18X18 RF (DISPOSABLE) ×1 IMPLANT
STAPLER VISISTAT 35W (STAPLE) IMPLANT
STEM CEMT ATTUNE 14X80 (Knees) ×1 IMPLANT
STEM REV PRESSFIT 10X60 (Knees) ×1 IMPLANT
SUT MNCRL AB 3-0 PS2 18 (SUTURE) ×2 IMPLANT
SUT STRATAFIX PDS+ 0 24IN (SUTURE) ×2 IMPLANT
SUT VIC AB 1 CT1 36 (SUTURE) ×3 IMPLANT
SUT VIC AB 2-0 CT1 27 (SUTURE) ×6
SUT VIC AB 2-0 CT1 TAPERPNT 27 (SUTURE) ×3 IMPLANT
SYR 3ML LL SCALE MARK (SYRINGE) ×1 IMPLANT
SYR 50ML LL SCALE MARK (SYRINGE) IMPLANT
TOWER CARTRIDGE SMART MIX (DISPOSABLE) ×2 IMPLANT
TRAY FOLEY MTR SLVR 14FR STAT (SET/KITS/TRAYS/PACK) ×1 IMPLANT
TRAY FOLEY MTR SLVR 16FR STAT (SET/KITS/TRAYS/PACK) ×2 IMPLANT
TUBE KAMVAC SUCTION (TUBING) IMPLANT
WATER STERILE IRR 1000ML POUR (IV SOLUTION) ×2 IMPLANT
WRAP KNEE MAXI GEL POST OP (GAUZE/BANDAGES/DRESSINGS) ×2 IMPLANT
YANKAUER SUCT BULB TIP 10FT TU (MISCELLANEOUS) IMPLANT

## 2019-08-01 NOTE — Anesthesia Procedure Notes (Signed)
Anesthesia Regional Block: Adductor canal block   Pre-Anesthetic Checklist: ,, timeout performed, Correct Patient, Correct Site, Correct Laterality, Correct Procedure, Correct Position, site marked, Risks and benefits discussed, pre-op evaluation,  At surgeon's request and post-op pain management  Laterality: Right  Prep: Maximum Sterile Barrier Precautions used, chloraprep       Needles:  Injection technique: Single-shot  Needle Type: Echogenic Stimulator Needle     Needle Length: 9cm  Needle Gauge: 21     Additional Needles:   Procedures:,,,, ultrasound used (permanent image in chart),,,,  Narrative:  Start time: 08/01/2019 10:20 AM End time: 08/01/2019 10:30 AM Injection made incrementally with aspirations every 5 mL.  Performed by: Personally  Anesthesiologist: Roderic Palau, MD  Additional Notes: 2% Lidocaine skin wheel.

## 2019-08-01 NOTE — Anesthesia Procedure Notes (Signed)
Date/Time: 08/01/2019 11:52 AM Performed by: Talbot Grumbling, CRNA Oxygen Delivery Method: Simple face mask

## 2019-08-01 NOTE — Interval H&P Note (Signed)
History and Physical Interval Note:  08/01/2019 9:57 AM  Latasha Snyder  has presented today for surgery, with the diagnosis of Failed right total knee.  The various methods of treatment have been discussed with the patient and family. After consideration of risks, benefits and other options for treatment, the patient has consented to  Procedure(s) with comments: TOTAL KNEE REVISION (Right) - 90 mins as a surgical intervention.  The patient's history has been reviewed, patient examined, no change in status, stable for surgery.  I have reviewed the patient's chart and labs.  Questions were answered to the patient's satisfaction.     Mauri Pole

## 2019-08-01 NOTE — Transfer of Care (Signed)
Immediate Anesthesia Transfer of Care Note  Patient: Latasha Snyder  Procedure(s) Performed: TOTAL KNEE REVISION (Right Knee)  Patient Location: PACU  Anesthesia Type:MAC combined with regional for post-op pain  Level of Consciousness: awake, oriented and patient cooperative  Airway & Oxygen Therapy: Patient Spontanous Breathing and Patient connected to face mask oxygen  Post-op Assessment: Report given to RN and Post -op Vital signs reviewed and stable  Post vital signs: Reviewed and stable  Last Vitals:  Vitals Value Taken Time  BP 111/57 08/01/19 1457  Temp    Pulse 89 08/01/19 1459  Resp 16 08/01/19 1459  SpO2 89 % 08/01/19 1459  Vitals shown include unvalidated device data.  Last Pain:  Vitals:   08/01/19 1040  TempSrc:   PainSc: 0-No pain         Complications: No apparent anesthesia complications

## 2019-08-01 NOTE — Progress Notes (Signed)
AssistedDr. Edmond Fitzgerald with right, ultrasound guided, adductor canal block. Side rails up, monitors on throughout procedure. See vital signs in flow sheet. Tolerated Procedure well.  

## 2019-08-01 NOTE — Anesthesia Postprocedure Evaluation (Signed)
Anesthesia Post Note  Patient: RYELYNN GUEDEA  Procedure(s) Performed: TOTAL KNEE REVISION (Right Knee)     Patient location during evaluation: PACU Anesthesia Type: Spinal and Regional Level of consciousness: oriented and awake and alert Pain management: pain level controlled Vital Signs Assessment: post-procedure vital signs reviewed and stable Respiratory status: spontaneous breathing, respiratory function stable and patient connected to nasal cannula oxygen Cardiovascular status: blood pressure returned to baseline and stable Postop Assessment: no headache, no backache, no apparent nausea or vomiting, spinal receding and patient able to bend at knees Anesthetic complications: no    Last Vitals:  Vitals:   08/01/19 1600 08/01/19 1615  BP: 107/78 118/74  Pulse: 67 65  Resp: 13 11  Temp:  36.6 C  SpO2: 100% 100%    Last Pain:  Vitals:   08/01/19 1600  TempSrc:   PainSc: 5                  Kenecia Barren,W. EDMOND

## 2019-08-01 NOTE — Anesthesia Preprocedure Evaluation (Addendum)
Anesthesia Evaluation  Patient identified by MRN, date of birth, ID band Patient awake    Reviewed: Allergy & Precautions, H&P , NPO status , Patient's Chart, lab work & pertinent test results  Airway Mallampati: III  TM Distance: >3 FB Neck ROM: Full    Dental no notable dental hx. (+) Teeth Intact, Dental Advisory Given   Pulmonary sleep apnea , former smoker,    Pulmonary exam normal breath sounds clear to auscultation       Cardiovascular hypertension,  Rhythm:Regular Rate:Normal     Neuro/Psych negative neurological ROS  negative psych ROS   GI/Hepatic negative GI ROS, Neg liver ROS,   Endo/Other  Morbid obesity  Renal/GU negative Renal ROS  negative genitourinary   Musculoskeletal  (+) Arthritis , Osteoarthritis,    Abdominal   Peds  Hematology  (+) Blood dyscrasia, anemia ,   Anesthesia Other Findings   Reproductive/Obstetrics negative OB ROS                            Anesthesia Physical Anesthesia Plan  ASA: III  Anesthesia Plan: Spinal   Post-op Pain Management:  Regional for Post-op pain   Induction: Intravenous  PONV Risk Score and Plan: 3 and Ondansetron, Dexamethasone, Propofol infusion and Midazolam  Airway Management Planned: Simple Face Mask  Additional Equipment:   Intra-op Plan:   Post-operative Plan:   Informed Consent: I have reviewed the patients History and Physical, chart, labs and discussed the procedure including the risks, benefits and alternatives for the proposed anesthesia with the patient or authorized representative who has indicated his/her understanding and acceptance.     Dental advisory given  Plan Discussed with: CRNA  Anesthesia Plan Comments:         Anesthesia Quick Evaluation

## 2019-08-01 NOTE — Anesthesia Procedure Notes (Signed)
Spinal  Patient location during procedure: OR Start time: 08/01/2019 11:47 AM End time: 08/01/2019 11:51 AM Staffing Anesthesiologist: Roderic Palau, MD Performed: anesthesiologist  Preanesthetic Checklist Completed: patient identified, surgical consent, pre-op evaluation, timeout performed, IV checked, risks and benefits discussed and monitors and equipment checked Spinal Block Patient position: sitting Prep: DuraPrep Patient monitoring: cardiac monitor, continuous pulse ox and blood pressure Approach: midline Location: L3-4 Injection technique: single-shot Needle Needle type: Pencan  Needle gauge: 24 G Needle length: 9 cm Assessment Sensory level: T8 Additional Notes Functioning IV was confirmed and monitors were applied. Sterile prep and drape, including hand hygiene and sterile gloves were used. The patient was positioned and the spine was prepped. The skin was anesthetized with lidocaine.  Free flow of clear CSF was obtained prior to injecting local anesthetic into the CSF.  The spinal needle aspirated freely following injection.  The needle was carefully withdrawn.  The patient tolerated the procedure well.

## 2019-08-02 LAB — BASIC METABOLIC PANEL
Anion gap: 8 (ref 5–15)
BUN: 16 mg/dL (ref 6–20)
CO2: 25 mmol/L (ref 22–32)
Calcium: 8 mg/dL — ABNORMAL LOW (ref 8.9–10.3)
Chloride: 102 mmol/L (ref 98–111)
Creatinine, Ser: 0.47 mg/dL (ref 0.44–1.00)
GFR calc Af Amer: >60 mL/min (ref >60)
GFR calc non Af Amer: >60 mL/min (ref >60)
Glucose, Bld: 102 mg/dL — ABNORMAL HIGH (ref 70–99)
Potassium: 3.9 mmol/L (ref 3.5–5.1)
Sodium: 135 mmol/L (ref 135–145)

## 2019-08-02 LAB — CBC
HCT: 28.2 % — ABNORMAL LOW (ref 36.0–46.0)
Hemoglobin: 8.8 g/dL — ABNORMAL LOW (ref 12.0–15.0)
MCH: 27.3 pg (ref 26.0–34.0)
MCHC: 31.2 g/dL (ref 30.0–36.0)
MCV: 87.6 fL (ref 80.0–100.0)
Platelets: 271 10*3/uL (ref 150–400)
RBC: 3.22 MIL/uL — ABNORMAL LOW (ref 3.87–5.11)
RDW: 14.6 % (ref 11.5–15.5)
WBC: 9.7 10*3/uL (ref 4.0–10.5)
nRBC: 0 % (ref 0.0–0.2)

## 2019-08-02 MED ORDER — METHOCARBAMOL 500 MG PO TABS: 500.0000 mg | ORAL_TABLET | Freq: Four times a day (QID) | ORAL | 0 refills | Status: DC | PRN

## 2019-08-02 MED ORDER — FERROUS SULFATE 325 (65 FE) MG PO TABS: 325.0000 mg | ORAL_TABLET | Freq: Three times a day (TID) | ORAL | 0 refills | Status: DC

## 2019-08-02 MED ORDER — ACETAMINOPHEN 500 MG PO TABS
1000.0000 mg | ORAL_TABLET | Freq: Three times a day (TID) | ORAL | Status: DC
  Administered 2019-08-02 – 2019-08-04 (×7): 1000 mg via ORAL
  Filled 2019-08-02 (×7): qty 2

## 2019-08-02 MED ORDER — OXYCODONE HCL 10 MG PO TABS: 10.0000 mg | ORAL_TABLET | ORAL | 0 refills | Status: DC | PRN

## 2019-08-02 MED ORDER — DOCUSATE SODIUM 100 MG PO CAPS: 100.0000 mg | ORAL_CAPSULE | Freq: Two times a day (BID) | ORAL | 0 refills | Status: DC

## 2019-08-02 MED ORDER — OXYCODONE HCL 5 MG PO TABS
10.0000 mg | ORAL_TABLET | ORAL | Status: DC | PRN
  Administered 2019-08-02 – 2019-08-04 (×13): 20 mg via ORAL
  Filled 2019-08-02 (×13): qty 4

## 2019-08-02 MED ORDER — ASPIRIN 81 MG PO CHEW: 81.0000 mg | CHEWABLE_TABLET | Freq: Two times a day (BID) | ORAL | 0 refills | Status: AC

## 2019-08-02 MED ORDER — POLYETHYLENE GLYCOL 3350 17 G PO PACK: 17.0000 g | PACK | Freq: Two times a day (BID) | ORAL | 0 refills | Status: DC

## 2019-08-02 NOTE — Progress Notes (Signed)
Physical Therapy Treatment Patient Details Name: Latasha Snyder MRN: [id number] DOB: 02-13-1976 Today's Date: 08/02/2019    History of Present Illness Pt s/p R TKR revision and with hx of bil TKR 2011    PT Comments    Pt very cooperative and with noted increased activity tolerance this pm.  Pt ambulated increased distance as well as up to bathroom for toileting and performing hygiene standing at sink.   Follow Up Recommendations  Follow surgeon's recommendation for DC plan and follow-up therapies     Equipment Recommendations  Rolling walker with 5" wheels    Recommendations for Other Services       Precautions / Restrictions Precautions Precautions: Knee Restrictions Weight Bearing Restrictions: Yes RLE Weight Bearing: Partial weight bearing RLE Partial Weight Bearing Percentage or Pounds: 50%    Mobility  Bed Mobility Overal bed mobility: Needs Assistance Bed Mobility: Supine to Sit     Supine to sit: Min assist     General bed mobility comments: cues for sequence and use of L LE to self assist  Transfers Overall transfer level: Needs assistance Equipment used: Rolling walker (2 wheeled) Transfers: Sit to/from Stand Sit to Stand: Min assist         General transfer comment: cues for LE management and use of UEs to self assist  Ambulation/Gait Ambulation/Gait assistance: Min assist Gait Distance (Feet): 30 Feet(and 20 into bathroom) Assistive device: Rolling walker (2 wheeled) Gait Pattern/deviations: Step-to pattern;Decreased step length - right;Decreased step length - left;Shuffle;Trunk flexed;Antalgic Gait velocity: decr   General Gait Details: cues for sequence, posture, position from RW and increased UE WB for PWB on R   Stairs             Wheelchair Mobility    Modified Rankin (Stroke Patients Only)       Balance Overall balance assessment: Mild deficits observed, not formally tested                                           Cognition Arousal/Alertness: Awake/alert Behavior During Therapy: WFL for tasks assessed/performed Overall Cognitive Status: Within Functional Limits for tasks assessed                                        Exercises      General Comments        Pertinent Vitals/Pain Pain Assessment: 0-10 Pain Score: 4  Pain Location: R knee Pain Descriptors / Indicators: Aching;Sore Pain Intervention(s): Limited activity within patient's tolerance;Monitored during session;Premedicated before session    Home Living                      Prior Function            PT Goals (current goals can now be found in the care plan section) Acute Rehab PT Goals Patient Stated Goal: Regain IND PT Goal Formulation: With patient Time For Goal Achievement: 08/09/19 Potential to Achieve Goals: Good Progress towards PT goals: Progressing toward goals    Frequency    7X/week      PT Plan Current plan remains appropriate    Co-evaluation              AM-PAC PT "6 Clicks" Mobility   Outcome Measure  Help needed turning  from your back to your side while in a flat bed without using bedrails?: A Little Help needed moving from lying on your back to sitting on the side of a flat bed without using bedrails?: A Little Help needed moving to and from a bed to a chair (including a wheelchair)?: A Little Help needed standing up from a chair using your arms (e.g., wheelchair or bedside chair)?: A Little Help needed to walk in hospital room?: A Little Help needed climbing 3-5 steps with a railing? : A Lot 6 Click Score: 17    End of Session Equipment Utilized During Treatment: Gait belt Activity Tolerance: Patient tolerated treatment well Patient left: in chair;with call bell/phone within reach;with chair alarm set Nurse Communication: Mobility status PT Visit Diagnosis: Difficulty in walking, not elsewhere classified (R26.2)     Time: 9563-8756 PT Time  Calculation (min) (ACUTE ONLY): 24 min  Charges:  $Gait Training: 8-22 mins $Therapeutic Activity: 8-22 mins                     Mauro Kaufmann PT Acute Rehabilitation Services Pager [phone] Office [phone]    Daisean Brodhead 08/02/2019, 6:18 PM

## 2019-08-02 NOTE — Progress Notes (Addendum)
Physical Therapy Treatment Patient Details Name: Latasha Snyder MRN: [id number] DOB: Aug 21, 1976 Today's Date: 08/02/2019    History of Present Illness Pt s/p R TKR revision and with hx of bil TKR 2011    PT Comments    Pt with improved pain control and agreeable to initiate therex program.   Follow Up Recommendations  Follow surgeon's recommendation for DC plan and follow-up therapies     Equipment Recommendations  Rolling walker with 5" wheels    Recommendations for Other Services       Precautions / Restrictions Precautions Precautions: Knee Restrictions Weight Bearing Restrictions: Yes RLE Weight Bearing: Partial weight bearing    Mobility  Bed Mobility Overal bed mobility: Needs Assistance Bed Mobility: Supine to Sit;Sit to Supine     Supine to sit: Min assist Sit to supine: Min assist   General bed mobility comments: cues for sequence and use of L LE to self assist  Transfers Overall transfer level: Needs assistance Equipment used: Rolling walker (2 wheeled) Transfers: Sit to/from Stand Sit to Stand: Min assist         General transfer comment: cues for LE management and use of UEs to self assist  Ambulation/Gait Ambulation/Gait assistance: Min assist Gait Distance (Feet): 14 Feet Assistive device: Rolling walker (2 wheeled) Gait Pattern/deviations: Step-to pattern;Decreased step length - right;Decreased step length - left;Shuffle;Trunk flexed;Antalgic     General Gait Details: cues for sequence, posture, position from RW and increased UE WB for PWB on R   Stairs             Wheelchair Mobility    Modified Rankin (Stroke Patients Only)       Balance Overall balance assessment: Mild deficits observed, not formally tested                                          Cognition Arousal/Alertness: Awake/alert Behavior During Therapy: WFL for tasks assessed/performed Overall Cognitive Status: Within Functional Limits for  tasks assessed                                        Exercises Total Joint Exercises Ankle Circles/Pumps: AROM;Both;20 reps;Supine Quad Sets: AROM;Both;10 reps;Supine Heel Slides: AAROM;10 reps;Supine;Right Straight Leg Raises: AAROM;Right;10 reps;Supine    General Comments        Pertinent Vitals/Pain Pain Assessment: 0-10 Pain Score: 4  Pain Location: R knee Pain Descriptors / Indicators: Aching;Sore Pain Intervention(s): Limited activity within patient's tolerance;Monitored during session;Premedicated before session;Ice applied    Home Living Family/patient expects to be discharged to:: Private residence Living Arrangements: Children Available Help at Discharge: Family Type of Home: House Home Access: Stairs to enter Entrance Stairs-Rails: Left Home Layout: One level Home Equipment: Cane - single point      Prior Function Level of Independence: Independent with assistive device(s)      Comments: using cane   PT Goals (current goals can now be found in the care plan section) Acute Rehab PT Goals Patient Stated Goal: Regain IND PT Goal Formulation: With patient Time For Goal Achievement: 08/09/19 Potential to Achieve Goals: Good Progress towards PT goals: Progressing toward goals    Frequency    7X/week      PT Plan Current plan remains appropriate    Co-evaluation  AM-PAC PT "6 Clicks" Mobility   Outcome Measure  Help needed turning from your back to your side while in a flat bed without using bedrails?: A Little Help needed moving from lying on your back to sitting on the side of a flat bed without using bedrails?: A Little Help needed moving to and from a bed to a chair (including a wheelchair)?: A Little Help needed standing up from a chair using your arms (e.g., wheelchair or bedside chair)?: A Little Help needed to walk in hospital room?: A Little Help needed climbing 3-5 steps with a railing? : A Lot 6 Click  Score: 17    End of Session Equipment Utilized During Treatment: Gait belt Activity Tolerance: Patient tolerated treatment well Patient left: in bed;with call bell/phone within reach;with bed alarm set Nurse Communication: Mobility status PT Visit Diagnosis: Difficulty in walking, not elsewhere classified (R26.2)     Time: 1127-1140 PT Time Calculation (min) (ACUTE ONLY): 13 min  Charges:  $Gait Training: 8-22 mins $Therapeutic Exercise: 8-22 mins                     [place] Pager [phone] Office [phone]    Jeris Easterly 08/02/2019, 12:41 PM

## 2019-08-02 NOTE — Discharge Instructions (Signed)

## 2019-08-02 NOTE — Progress Notes (Signed)
     Subjective: 1 Day Post-Op Procedure(s) (LRB): TOTAL KNEE REVISION (Right)   Seen by Dr. Alvan Dame. Patient reports pain as severe, pain not fully controlled. No reported events throughout the night. Dr. Alvan Dame discussed the procedure, findings and expectations.     Objective:   VITALS:   Vitals:   08/02/19 0209 08/02/19 0555  BP: (!) 101/56 (!) 96/52  Pulse: 65 (!) 52  Resp: 16 18  Temp: 97.7 F (36.5 C) 98.4 F (36.9 C)  SpO2: 100% 100%    Dorsiflexion/Plantar flexion intact Incision: dressing C/D/I No cellulitis present Compartment soft  LABS Recent Labs    08/02/19 0248  HGB 8.8*  HCT 28.2*  WBC 9.7  PLT 271    Recent Labs    08/02/19 0248  NA 135  K 3.9  BUN 16  CREATININE 0.47  GLUCOSE 102*     Assessment/Plan: 1 Day Post-Op Procedure(s) (LRB): TOTAL KNEE REVISION (Right) Change analgesic meds Change to [facility] Advance diet Up with therapy D/C IV fluids Discharge home, probably tomorrow  Morbid Obesity (BMI >40)  Estimated body mass index is 42.99 kg/m as calculated from the following:   Height as of this encounter: 5\' 1"  (1.549 m).   Weight as of this encounter: 103.2 kg. Patient also counseled that weight may inhibit the healing process Patient counseled that losing weight will help with future health issues      [place] Pugh. [place]   PAC  08/02/2019, 8:51 AM

## 2019-08-02 NOTE — Evaluation (Addendum)
Physical Therapy Evaluation Patient Details Name: Latasha Snyder MRN: [id number] DOB: 1976/05/24 Today's Date: 08/02/2019   History of Present Illness  Pt s/p R TKR revision and with hx of bil TKR 2011  Clinical Impression  Pt s/p R TKR revision and presents with decreased R LE strength/ROM and post op pain limiting functional mobility.  Pt should progress to dc home with family assist.  Pt states she has OP PT scheduled for coming Monday but is requesting HHPT instead - RN aware.    Follow Up Recommendations Follow surgeon's recommendation for DC plan and follow-up therapies(Pt requesting HHPT )    Equipment Recommendations  Rolling walker with 5" wheels    Recommendations for Other Services       Precautions / Restrictions Precautions Precautions: Knee Restrictions Weight Bearing Restrictions: Yes RLE Weight Bearing: Partial weight bearing      Mobility  Bed Mobility Overal bed mobility: Needs Assistance Bed Mobility: Supine to Sit;Sit to Supine     Supine to sit: Min assist Sit to supine: Min assist   General bed mobility comments: cues for sequence and use of L LE to self assist  Transfers Overall transfer level: Needs assistance Equipment used: Rolling walker (2 wheeled) Transfers: Sit to/from Stand Sit to Stand: Min assist         General transfer comment: cues for LE management and use of UEs to self assist  Ambulation/Gait Ambulation/Gait assistance: Min assist Gait Distance (Feet): 14 Feet Assistive device: Rolling walker (2 wheeled) Gait Pattern/deviations: Step-to pattern;Decreased step length - right;Decreased step length - left;Shuffle;Trunk flexed;Antalgic     General Gait Details: cues for sequence, posture, position from RW and increased UE WB for PWB on R  Stairs            Wheelchair Mobility    Modified Rankin (Stroke Patients Only)       Balance Overall balance assessment: Mild deficits observed, not formally tested                                            Pertinent Vitals/Pain Pain Assessment: 0-10 Pain Score: 6  Pain Location: R knee Pain Descriptors / Indicators: Aching;Sore Pain Intervention(s): Limited activity within patient's tolerance;Monitored during session;Premedicated before session;Ice applied    Home Living Family/patient expects to be discharged to:: Private residence Living Arrangements: Children Available Help at Discharge: Family Type of Home: House Home Access: Stairs to enter Entrance Stairs-Rails: Left Entrance Stairs-Number of Steps: 4 Home Layout: One level Home Equipment: Cane - single point      Prior Function Level of Independence: Independent with assistive device(s)         Comments: using cane     Hand Dominance        Extremity/Trunk Assessment   Upper Extremity Assessment Upper Extremity Assessment: Overall WFL for tasks assessed    Lower Extremity Assessment Lower Extremity Assessment: RLE deficits/detail    Cervical / Trunk Assessment Cervical / Trunk Assessment: Normal  Communication   Communication: No difficulties  Cognition Arousal/Alertness: Awake/alert Behavior During Therapy: WFL for tasks assessed/performed Overall Cognitive Status: Within Functional Limits for tasks assessed                                        General Comments  Exercises     Assessment/Plan    PT Assessment Patient needs continued PT services  PT Problem List Decreased strength;Decreased range of motion;Decreased activity tolerance;Decreased mobility;Decreased knowledge of use of DME;Pain;Obesity       PT Treatment Interventions DME instruction;Gait training;Stair training;Functional mobility training;Therapeutic activities;Therapeutic exercise;Patient/family education    PT Goals (Current goals can be found in the Care Plan section)  Acute Rehab PT Goals Patient Stated Goal: Regain IND PT Goal Formulation: With  patient Time For Goal Achievement: 08/09/19 Potential to Achieve Goals: Good    Frequency 7X/week   Barriers to discharge        Co-evaluation               AM-PAC PT "6 Clicks" Mobility  Outcome Measure Help needed turning from your back to your side while in a flat bed without using bedrails?: A Little Help needed moving from lying on your back to sitting on the side of a flat bed without using bedrails?: A Little Help needed moving to and from a bed to a chair (including a wheelchair)?: A Little Help needed standing up from a chair using your arms (e.g., wheelchair or bedside chair)?: A Little Help needed to walk in hospital room?: A Little Help needed climbing 3-5 steps with a railing? : A Lot 6 Click Score: 17    End of Session Equipment Utilized During Treatment: Gait belt Activity Tolerance: Patient tolerated treatment well Patient left: in bed;with call bell/phone within reach;with bed alarm set Nurse Communication: Mobility status PT Visit Diagnosis: Difficulty in walking, not elsewhere classified (R26.2)    Time: 2376-2831 PT Time Calculation (min) (ACUTE ONLY): 25 min   Charges:   PT Evaluation $PT Eval Low Complexity: 1 Low PT Treatments $Gait Training: 8-22 mins        [place] Pager [phone] Office [phone]   Trinadee Verhagen 08/02/2019, 12:36 PM

## 2019-08-02 NOTE — Op Note (Signed)
NAME: Latasha Snyder, Latasha Snyder MEDICAL RECORD YS:[id number] ACCOUNT [id number] DATE OF BIRTH:1976/03/29 FACILITY: [facility] LOCATION: [id number] PHYSICIAN:Whitnee Orzel Marian Sorrow, MD  OPERATIVE REPORT  DATE OF PROCEDURE:  08/02/2019  PREOPERATIVE DIAGNOSIS:  Failed right total knee replacement.  POSTOPERATIVE DIAGNOSIS:  Failed right total knee replacement.  PROCEDURE:  Rerevision right total knee replacement.  COMPONENTS USED:  Size 4 Attune revision femoral component with size 14 x 80 cemented stem, size 12 mm distal lateral augment medially and 8 mm lateral, and 8 mm posterior augments medial and lateral.  A 14 mm insert.  A size 4 revision tibial tray with  a 10 x 68 press-fit stem and a 61 porous coated sleeve.  SURGEON:  Paralee Cancel, MD  ASSISTANT:  Danae Orleans PA-C.  Note that Mr. Latasha Snyder was present for the entirety of the case from preoperative positioning, perioperative management of the operative extremity, general facilitation of case, and primary wound closure.  ANESTHESIA:  Regional plus spinal.  TOURNIQUET TIME:  78 minutes at 250 mmHg.  BLOOD LOSS:  150 mL.  DRAINS:  None.  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  She had undergone bilateral total knee replacements approximately 5 years ago.  Unfortunately, these have failed.   These have failed due to tibial component and the stresses therein with loosening of the tibial tray obviously and significant instability of her knee.  She wished at this point to proceed with total knee arthroplasty because of the significant reduction  in quality of life despite her weight and the risk factors outlined and associated with this.  Standard risk of infection, DVT, component failure, need for future surgeries were discussed and reviewed as well as the need to have her left knee revised.   Consent was obtained for benefit of pain relief.  DESCRIPTION OF  PROCEDURE:  The patient was brought to the operative theater.  Once adequate anesthesia, preoperative antibiotics as well as tranexamic acid and Decadron administered she was positioned supine with a right thigh tourniquet placed.  The  right lower extremity was then prepped and draped in sterile fashion.  A timeout was performed identifying the patient, the planned procedure and extremity.  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  Her old incision was utilized and extended slightly proximal and distal.  We exposed the aspect of the knee encountering clear synovial fluid.  No signs of  infection.  With the knee exposed, I was able to remove the tibial component that had subsided within the proximal tibia and with unstable medial proximal tibial bone.  I removed the remaining cement noting significant fibrous tissue on the undersurface  of the component and the bone.  The femoral component was removed using a thin oscillating saw and osteotomes without bone loss.  Once these components were removed, I did keep the patella button in place to protect the patella from retractors and saw blades during the revision portion.  At this point, we prepared the tibia.  Minimal bone was removed off the proximal lateral tibia  to give a basis.  There was significant loss of bone due to her varus in the tibia medially.  For this reason, I was going to bypass this with a fully porous coated sleeve.  I broached up initially to the 53  broach and placed a trial component to use  this to prepare the femur.  The femur was prepared using revision component size 4, revising these cuts per protocol.  I did prepare for a cemented stem on both sides.  During trial reductions, we found that we required upsizing the tibial sleeve to a 61  as well as using augments on the distal femur  to try to get rid of some of her hyperextension.  This was best done at this point with the augments as noted as well as a 14 mm insert.  I did not feel that there was any other bone stock for me to continue  to alter her tibia side.  Given all the findings during the trial components, we irrigated the knee copiously with normal saline solution.  We prepared the bone as best as possible for these revision components.  Once this was done, the final components  were opened and configured on the back table.  We mixed cement for the femoral side.  The cement restrictor was placed distally.  I then impacted the tibial component to where the broach sat.  It had good initial rotational stability as best we could  tell.  Final femoral component was cemented into place.  The knee was brought to extension with a 14 mm insert.  Extruded cement was removed.  Once the cement fully cured excessive cement was removed throughout the knee.  I did elect to go to the 14 mm  insert as opposed to a 12 via stability in both extension and flexion.  There was still a slight bit of varus of her tibial component in this set up.  I am hopeful that we will get bony ingrowth into the sleeve to provide stability.  Otherwise, this system may end up failing.    The extensor mechanism was then reapproximated over the knee.  We also identified significant destruction to the patella.  I removed the old patella button and used a new 35 patellar button.  This was also cemented into place.  The extensor mechanism was  reapproximated using #1 Vicryl and #1 Stratafix suture.  The remainder of the wound was closed with 2-0 Vicryl and a running Monocryl stitch.  The knee was then cleaned, dried and dressed sterilely using surgical glue and Aquacel dressing.  She was  brought to the recovery room in stable condition, tolerating the procedure well.  Findings were reviewed with her friend.  Postoperatively, I will likely have her be partial  weightbearing as best as she can do given the deficits of her left knee.  This  will allow for bony ingrowth.  We will need to probably give her 3 months before we can proceed with her left knee to try to reduce the strain on this right knee.  CN/NUANCE  D:08/02/2019 T:08/02/2019 JOB:[id number]

## 2019-08-02 NOTE — Brief Op Note (Signed)
08/01/2019  2:00PM  PATIENT:  Latasha Snyder  43 y.o. female  PRE-OPERATIVE DIAGNOSIS:  Failed right total knee replacement  POST-OPERATIVE DIAGNOSIS:  Failed right total knee replacement  PROCEDURE:  Procedure(s) with comments: TOTAL KNEE REVISION (Right) - 90 mins  SURGEON:  Surgeon(s) and Role:    Paralee Cancel, MD - Primary  PHYSICIAN ASSISTANT: Danae Orleans, PA-C  ANESTHESIA:   regional and spinal  EBL:  150 mL   BLOOD ADMINISTERED:none  DRAINS: none   LOCAL MEDICATIONS USED:  MARCAINE     SPECIMEN:  No Specimen  DISPOSITION OF SPECIMEN:  N/A  COUNTS:  YES  TOURNIQUET:   Total Tourniquet Time Documented: Thigh (Right) - 78 minutes Total: Thigh (Right) - 78 minutes   DICTATION: .Other Dictation: Dictation Number [id number]  PLAN OF CARE: Admit to inpatient   PATIENT DISPOSITION:  PACU - hemodynamically stable.   Delay start of Pharmacological VTE agent (>24hrs) due to surgical blood loss or risk of bleeding: no

## 2019-08-03 LAB — BASIC METABOLIC PANEL
Anion gap: 8 (ref 5–15)
BUN: 16 mg/dL (ref 6–20)
CO2: 27 mmol/L (ref 22–32)
Calcium: 8.1 mg/dL — ABNORMAL LOW (ref 8.9–10.3)
Chloride: 101 mmol/L (ref 98–111)
Creatinine, Ser: 0.6 mg/dL (ref 0.44–1.00)
GFR calc Af Amer: >60 mL/min (ref >60)
GFR calc non Af Amer: >60 mL/min (ref >60)
Glucose, Bld: 92 mg/dL (ref 70–99)
Potassium: 4.2 mmol/L (ref 3.5–5.1)
Sodium: 136 mmol/L (ref 135–145)

## 2019-08-03 LAB — CBC
HCT: 25.7 % — ABNORMAL LOW (ref 36.0–46.0)
Hemoglobin: 7.9 g/dL — ABNORMAL LOW (ref 12.0–15.0)
MCH: 27.1 pg (ref 26.0–34.0)
MCHC: 30.7 g/dL (ref 30.0–36.0)
MCV: 88.3 fL (ref 80.0–100.0)
Platelets: 223 10*3/uL (ref 150–400)
RBC: 2.91 MIL/uL — ABNORMAL LOW (ref 3.87–5.11)
RDW: 15 % (ref 11.5–15.5)
WBC: 8.7 10*3/uL (ref 4.0–10.5)
nRBC: 0 % (ref 0.0–0.2)

## 2019-08-03 NOTE — Progress Notes (Signed)
Physical Therapy Treatment Patient Details Name: Latasha Snyder MRN: [id number] DOB: 10/17/1975 Today's Date: 08/03/2019    History of Present Illness Pt s/p R TKR revision and with hx of bil TKR 2011    PT Comments    Pt continues motivated and progressing slowly with mobility but hopeful for dc home tomorrow.   Follow Up Recommendations  Follow surgeon's recommendation for DC plan and follow-up therapies     Equipment Recommendations  Rolling walker with 5" wheels    Recommendations for Other Services       Precautions / Restrictions Precautions Precautions: Knee Restrictions Weight Bearing Restrictions: Yes RLE Weight Bearing: Partial weight bearing RLE Partial Weight Bearing Percentage or Pounds: 50%    Mobility  Bed Mobility Overal bed mobility: Needs Assistance Bed Mobility: Supine to Sit     Supine to sit: Min guard     General bed mobility comments: cues for sequence and use of L LE to self assist; pt self assisting R LE with gait belt  Transfers Overall transfer level: Needs assistance Equipment used: Rolling walker (2 wheeled) Transfers: Sit to/from Stand Sit to Stand: Min guard         General transfer comment: cues for LE management and use of UEs to self assist  Ambulation/Gait Ambulation/Gait assistance: Min assist Gait Distance (Feet): 26 Feet Assistive device: Rolling walker (2 wheeled) Gait Pattern/deviations: Step-to pattern;Decreased step length - right;Decreased step length - left;Shuffle;Trunk flexed;Antalgic Gait velocity: decr   General Gait Details: cues for sequence, posture, position from RW and increased UE WB for PWB on R   Stairs             Wheelchair Mobility    Modified Rankin (Stroke Patients Only)       Balance Overall balance assessment: Mild deficits observed, not formally tested                                          Cognition Arousal/Alertness: Awake/alert Behavior During  Therapy: WFL for tasks assessed/performed Overall Cognitive Status: Within Functional Limits for tasks assessed                                        Exercises Total Joint Exercises Ankle Circles/Pumps: AROM;Both;20 reps;Supine Quad Sets: AROM;Both;10 reps;Supine Heel Slides: AAROM;10 reps;Supine;Right Straight Leg Raises: AAROM;Right;10 reps;Supine Goniometric ROM: AAROM R knee - 5 - 35    General Comments        Pertinent Vitals/Pain Pain Assessment: 0-10 Pain Score: 4  Pain Location: R knee Pain Descriptors / Indicators: Aching;Sore Pain Intervention(s): Limited activity within patient's tolerance;Monitored during session;Premedicated before session;Ice applied    Home Living                      Prior Function            PT Goals (current goals can now be found in the care plan section) Acute Rehab PT Goals Patient Stated Goal: Regain IND PT Goal Formulation: With patient Time For Goal Achievement: 08/09/19 Potential to Achieve Goals: Good Progress towards PT goals: Progressing toward goals    Frequency    7X/week      PT Plan Current plan remains appropriate    Co-evaluation  AM-PAC PT "6 Clicks" Mobility   Outcome Measure  Help needed turning from your back to your side while in a flat bed without using bedrails?: A Little Help needed moving from lying on your back to sitting on the side of a flat bed without using bedrails?: A Little Help needed moving to and from a bed to a chair (including a wheelchair)?: A Little Help needed standing up from a chair using your arms (e.g., wheelchair or bedside chair)?: A Little Help needed to walk in hospital room?: A Little Help needed climbing 3-5 steps with a railing? : A Lot 6 Click Score: 17    End of Session Equipment Utilized During Treatment: Gait belt Activity Tolerance: Patient tolerated treatment well Patient left: in chair;with call bell/phone within  reach;with chair alarm set Nurse Communication: Mobility status PT Visit Diagnosis: Difficulty in walking, not elsewhere classified (R26.2)     Time: 1011-1040 PT Time Calculation (min) (ACUTE ONLY): 29 min  Charges:  $Gait Training: 8-22 mins $Therapeutic Exercise: 8-22 mins                     [place] Pager [phone] Office [phone]    Jeanifer Halliday 08/03/2019, 1:07 PM

## 2019-08-03 NOTE — Progress Notes (Signed)
   Subjective: 2 Days Post-Op Procedure(s) (LRB): TOTAL KNEE REVISION (Right) Patient reports pain as moderate.   Patient seen in rounds for Dr. Alvan Dame. Patient is well, and has had no acute complaints or problems other than discomfort in the right knee. No acute events overnight. She notes some improvement with pain today. Patient states she does not feel ready to go home today. Voiding without difficulty, positive flatus. Denies CP, SHOB, N/V.  Plan is to go Home after hospital stay.  Objective: Vital signs in last 24 hours: Temp:  [98.1 F (36.7 C)-99.4 F (37.4 C)] 99.4 F (37.4 C) (10/24 0529) Pulse Rate:  [67-87] 73 (10/24 0529) Resp:  [16-19] 16 (10/24 0529) BP: (101-114)/(52-68) 113/52 (10/24 0529) SpO2:  [99 %-100 %] 100 % (10/24 0529)  Intake/Output from previous day:  Intake/Output Summary (Last 24 hours) at 08/03/2019 0837 Last data filed at 08/03/2019 0600 Gross per 24 hour  Intake 1309.79 ml  Output 750 ml  Net 559.79 ml    Intake/Output this shift: No intake/output data recorded.  Labs: Recent Labs    08/02/19 0248 08/03/19 0333  HGB 8.8* 7.9*   Recent Labs    08/02/19 0248 08/03/19 0333  WBC 9.7 8.7  RBC 3.22* 2.91*  HCT 28.2* 25.7*  PLT 271 223   Recent Labs    08/02/19 0248 08/03/19 0333  NA 135 136  K 3.9 4.2  CL 102 101  CO2 25 27  BUN 16 16  CREATININE 0.47 0.60  GLUCOSE 102* 92  CALCIUM 8.0* 8.1*   No results for input(s): LABPT, INR in the last 72 hours.  Exam: General - Patient is Alert and Oriented Extremity - Neurologically intact Sensation intact distally Intact pulses distally Dorsiflexion/Plantar flexion intact Dressing/Incision - clean, dry, no drainage Motor Function - intact, moving foot and toes well on exam.   Past Medical History:  Diagnosis Date  . Anemia   . Arthritis   . Hypertension   . Morbid obesity ([place])   . Sleep apnea    mild no cpap and weight loss    Assessment/Plan: 2 Days Post-Op  Procedure(s) (LRB): TOTAL KNEE REVISION (Right) Active Problems:   S/P right TKA revision   Morbid obesity ([place])  Estimated body mass index is 42.99 kg/m as calculated from the following:   Height as of this encounter: 5\' 1"  (1.549 m).   Weight as of this encounter: 103.2 kg. Advance diet Up with therapy  DVT Prophylaxis - Aspirin PWB to the RLE  Plan for discharge home following hospital stay. Plan for discharge Sunday. She will continue working with therapy towards safe discharge home. Continue optimizing pain control. Follow up in the office with Dr. Alvan Dame in 2 weeks.   Griffith Citron, PA-C Orthopedic Surgery [phone] 08/03/2019, 8:37 AM

## 2019-08-03 NOTE — Progress Notes (Signed)
**Note Latasha-Identified via Obfuscation** Physical Therapy Treatment Patient Details Name: Latasha Snyder MRN: [id number] DOB: September 29, 1976 Today's Date: 08/03/2019    History of Present Illness Pt s/p R TKR revision and with hx of bil TKR 2011    PT Comments    Pt continues cooperative and with marked improvement in activity tolerance this pm.  Pt ambulating increased distance in hall as well as to bathroom and up at sink performing hygiene.   Follow Up Recommendations  Follow surgeon's recommendation for DC plan and follow-up therapies     Equipment Recommendations  Rolling walker with 5" wheels    Recommendations for Other Services       Precautions / Restrictions Precautions Precautions: Knee Restrictions Weight Bearing Restrictions: Yes RLE Weight Bearing: Partial weight bearing RLE Partial Weight Bearing Percentage or Pounds: 50%    Mobility  Bed Mobility Overal bed mobility: Needs Assistance Bed Mobility: Supine to Sit;Sit to Supine     Supine to sit: Min guard Sit to supine: Min guard   General bed mobility comments: cues for sequence and use of L LE to self assist; pt self assisting R LE with gait belt  Transfers Overall transfer level: Needs assistance Equipment used: Rolling walker (2 wheeled) Transfers: Sit to/from Stand Sit to Stand: Min guard;Supervision         General transfer comment: cues for LE management and use of UEs to self assist  Ambulation/Gait Ambulation/Gait assistance: Min guard Gait Distance (Feet): 60 Feet(and additional 20' back from bathroom) Assistive device: Rolling walker (2 wheeled) Gait Pattern/deviations: Step-to pattern;Decreased step length - right;Decreased step length - left;Shuffle;Trunk flexed;Antalgic Gait velocity: decr   General Gait Details: cues for sequence, posture, position from RW and increased UE WB for PWB on R   Stairs             Wheelchair Mobility    Modified Rankin (Stroke Patients Only)       Balance Overall balance  assessment: Mild deficits observed, not formally tested                                          Cognition Arousal/Alertness: Awake/alert Behavior During Therapy: WFL for tasks assessed/performed Overall Cognitive Status: Within Functional Limits for tasks assessed                                        Exercises      General Comments        Pertinent Vitals/Pain Pain Assessment: 0-10 Pain Score: 6  Pain Location: R knee Pain Descriptors / Indicators: Aching;Sore Pain Intervention(s): Limited activity within patient's tolerance;Monitored during session;Premedicated before session(pt declines ice at this time)    Home Living                      Prior Function            PT Goals (current goals can now be found in the care plan section) Acute Rehab PT Goals Patient Stated Goal: Regain IND PT Goal Formulation: With patient Time For Goal Achievement: 08/09/19 Potential to Achieve Goals: Good Progress towards PT goals: Progressing toward goals    Frequency    7X/week      PT Plan Current plan remains appropriate    Co-evaluation  AM-PAC PT "6 Clicks" Mobility   Outcome Measure  Help needed turning from your back to your side while in a flat bed without using bedrails?: A Little Help needed moving from lying on your back to sitting on the side of a flat bed without using bedrails?: A Little Help needed moving to and from a bed to a chair (including a wheelchair)?: A Little Help needed standing up from a chair using your arms (e.g., wheelchair or bedside chair)?: A Little Help needed to walk in hospital room?: A Little Help needed climbing 3-5 steps with a railing? : A Lot 6 Click Score: 17    End of Session Equipment Utilized During Treatment: Gait belt Activity Tolerance: Patient tolerated treatment well Patient left: in bed;with call bell/phone within reach Nurse Communication: Mobility  status PT Visit Diagnosis: Difficulty in walking, not elsewhere classified (R26.2)     Time: 1450-1520 PT Time Calculation (min) (ACUTE ONLY): 30 min  Charges:  $Gait Training: 8-22 mins $Therapeutic Activity: 8-22 mins                     Latasha Snyder PT Acute Rehabilitation Services Pager [phone] Office [phone]    Latasha Snyder 08/03/2019, 5:11 PM

## 2019-08-04 NOTE — TOC Progression Note (Signed)
Transition of Care [facility]) - Progression Note    Patient Details  Name: Latasha Snyder MRN: [id number] Date of Birth: 23-Dec-1975  Transition [facility] CM/SW Contact  Joaquin Courts, RN Phone Number: 08/04/2019, 9:00 AM  Clinical Narrative:    CM spoke with patient. Patient set up with [facility] for HHPT. Patient reports she has rolling walker and 3-in-1 at home.     Expected Discharge Plan: [place] Barriers to Discharge: No Barriers Identified  Expected Discharge Plan and Services Expected Discharge Plan: [place]   Discharge Planning Services: CM Consult Post Acute Care Choice: [place] arrangements for the past 2 months: Single Family Home Expected Discharge Date: 08/04/19               DME Arranged: N/A DME Agency: NA       HH Arranged: PT HH Agency: [place] Date HH Agency Contacted: 08/04/19 Time [place]: [phone] Representative spoke with at [place]: [place] ([place]) Interventions    Readmission Risk Interventions No flowsheet data found.

## 2019-08-04 NOTE — Progress Notes (Signed)

## 2019-08-04 NOTE — Progress Notes (Signed)
Physical Therapy Treatment Patient Details Name: Latasha Snyder MRN: [id number] DOB: 1976-01-31 Today's Date: 08/04/2019    History of Present Illness Pt s/p R TKR revision and with hx of bil TKR 2011    PT Comments    Pt performed home therex program with assist and educated on techniques to self assist.  Written instruction provided and reviewed.   Follow Up Recommendations  Follow surgeon's recommendation for DC plan and follow-up therapies     Equipment Recommendations  Rolling walker with 5" wheels    Recommendations for Other Services       Precautions / Restrictions Precautions Precautions: Knee Restrictions Weight Bearing Restrictions: Yes RLE Weight Bearing: Partial weight bearing RLE Partial Weight Bearing Percentage or Pounds: 50%    Mobility  Bed Mobility Overal bed mobility: Needs Assistance Bed Mobility: Supine to Sit       Sit to supine: Supervision   General bed mobility comments: cues for sequence and use of L LE to self assist; pt self assisting R LE with gait belt  Transfers Overall transfer level: Needs assistance Equipment used: Rolling walker (2 wheeled) Transfers: Sit to/from Stand Sit to Stand: Supervision         General transfer comment: pt self-cues for LE management and use of UEs to self assist  Ambulation/Gait Ambulation/Gait assistance: Min guard;Supervision Gait Distance (Feet): 60 Feet Assistive device: Rolling walker (2 wheeled) Gait Pattern/deviations: Step-to pattern;Decreased step length - right;Decreased step length - left;Shuffle;Trunk flexed;Antalgic Gait velocity: decr       Stairs Stairs: Yes Stairs assistance: Min assist Stair Management: One rail Right;Step to pattern;Forwards;With crutches Number of Stairs: 4 General stair comments: 2 steps twice with cues for sequence and foot/crutch placement   Wheelchair Mobility    Modified Rankin (Stroke Patients Only)       Balance Overall balance  assessment: Mild deficits observed, not formally tested                                          Cognition Arousal/Alertness: Awake/alert Behavior During Therapy: WFL for tasks assessed/performed Overall Cognitive Status: Within Functional Limits for tasks assessed                                        Exercises Total Joint Exercises Ankle Circles/Pumps: AROM;Both;20 reps;Supine Quad Sets: AROM;Both;10 reps;Supine Heel Slides: AAROM;Supine;Right;20 reps Straight Leg Raises: AAROM;Right;Supine;20 reps Goniometric ROM: AAROM R knee -5 - 35    General Comments        Pertinent Vitals/Pain Pain Assessment: 0-10 Pain Score: 7  Pain Location: R knee Pain Descriptors / Indicators: Aching;Sore Pain Intervention(s): Limited activity within patient's tolerance;Monitored during session;Premedicated before session;Ice applied    Home Living                      Prior Function            PT Goals (current goals can now be found in the care plan section) Acute Rehab PT Goals Patient Stated Goal: Regain IND PT Goal Formulation: With patient Time For Goal Achievement: 08/09/19 Potential to Achieve Goals: Good Progress towards PT goals: Progressing toward goals    Frequency    7X/week      PT Plan Current plan remains appropriate    Co-evaluation  AM-PAC PT "6 Clicks" Mobility   Outcome Measure  Help needed turning from your back to your side while in a flat bed without using bedrails?: A Little Help needed moving from lying on your back to sitting on the side of a flat bed without using bedrails?: A Little Help needed moving to and from a bed to a chair (including a wheelchair)?: A Little Help needed standing up from a chair using your arms (e.g., wheelchair or bedside chair)?: A Little Help needed to walk in hospital room?: A Little Help needed climbing 3-5 steps with a railing? : A Little 6 Click Score:  18    End of Session Equipment Utilized During Treatment: Gait belt Activity Tolerance: Patient tolerated treatment well Patient left: in bed;with call bell/phone within reach Nurse Communication: Mobility status PT Visit Diagnosis: Difficulty in walking, not elsewhere classified (R26.2)     Time: 0762-2633 PT Time Calculation (min) (ACUTE ONLY): 22 min  Charges:  $Gait Training: 23-37 mins $Therapeutic Exercise: 8-22 mins                     Mauro Kaufmann PT Acute Rehabilitation Services Pager [phone] Office [phone]    Latasha Snyder 08/04/2019, 12:53 PM

## 2019-08-04 NOTE — Discharge Summary (Addendum)
Physician Discharge Summary  Patient ID: Latasha Snyder MRN: [id number] DOB/AGE: May 13, 1976 43 y.o.  Admit date: 08/01/2019 Discharge date: 08/04/2019  Admission Diagnoses: R knee OA; morbid obesity, obstructive sleep apnea; HTN; hx of anemia.  Discharge Diagnoses:  Active Problems:   S/P right TKA revision   Morbid obesity (HCC) same as above  Discharged Condition: stable  Hospital Course: Patient presented to [facility] OR on 08/02/19 for elective R TKA revision by Dr. Charlann Boxer.  The patient tolerated the procedure well without complication.  She was then admitted to the hospital.  She worked well with therapy.  She tolerated her stay well.  To be D/C'd home on 08/04/19.  Consults: PT  Significant Diagnostic Studies: radiology: X-Ray: in outpatient setting to determine severity of diagnosis and assist with appropriate definite treatment decision making.  Treatments: IV hydration, antibiotics: Ancef, analgesia: acetaminophen, Dilaudid and oxycodone, anticoagulation: ASA and surgery: as stated above.  Discharge Exam: Blood pressure 112/67, pulse 78, temperature 98.6 F (37 C), temperature source Oral, resp. rate 14, height 5\' 1"  (1.549 m), weight 103.2 kg, last menstrual period 07/15/2019, SpO2 100 %. General: WDWN patient in NAD. Psych:  Appropriate mood and affect. Neuro:  A&O x 3, Moving all extremities, sensation intact to light touch HEENT:  EOMs intact Chest:  Even non-labored respirations Skin:  Dressing C/D/I, no rashes or lesions Extremities: warm/dry, mild edema,  No erythema or echymosis.  No lymphadenopathy. Pulses: Popliteus 2+ MSK:  ROM: lacks 5 degrees TKE, MMT: able to perform quad set, (-) Homan's   Disposition: Discharge disposition: 01-Home or Self Care       Discharge Instructions    Call MD / Call 911   Complete by: As directed    If you experience chest pain or shortness of breath, CALL 911 and be transported to the hospital emergency room.  If you develope a  fever above 101 F, pus (white drainage) or increased drainage or redness at the wound, or calf pain, call your surgeon's office.   Call MD / Call 911   Complete by: As directed    If you experience chest pain or shortness of breath, CALL 911 and be transported to the hospital emergency room.  If you develope a fever above 101 F, pus (white drainage) or increased drainage or redness at the wound, or calf pain, call your surgeon's office.   Change dressing   Complete by: As directed    Maintain surgical dressing until follow up in the clinic. If the edges start to pull up, may reinforce with tape. If the dressing is no longer working, may remove and cover with gauze and tape, but must keep the area dry and clean.  Call with any questions or concerns.   Constipation Prevention   Complete by: As directed    Drink plenty of fluids.  Prune juice may be helpful.  You may use a stool softener, such as Colace (over the counter) 100 mg twice a day.  Use MiraLax (over the counter) for constipation as needed.   Constipation Prevention   Complete by: As directed    Drink plenty of fluids.  Prune juice may be helpful.  You may use a stool softener, such as Colace (over the counter) 100 mg twice a day.  Use MiraLax (over the counter) for constipation as needed.   Diet - low sodium heart healthy   Complete by: As directed    Discharge instructions   Complete by: As directed    Maintain  surgical dressing until follow up in the clinic. If the edges start to pull up, may reinforce with tape. If the dressing is no longer working, may remove and cover with gauze and tape, but must keep the area dry and clean.  Follow up in 2 weeks at [facility]. Call with any questions or concerns.   Increase activity slowly as tolerated   Complete by: As directed    Partial weight bearing with assist device as directed.  50% right leg   Increase activity slowly as tolerated   Complete by: As directed    TED hose    Complete by: As directed    Use stockings (TED hose) for 2 weeks on both leg(s).  You may remove them at night for sleeping.     Allergies as of 08/04/2019   No Known Allergies     Medication List    STOP taking these medications   baclofen 20 MG tablet Commonly known as: LIORESAL   HYDROcodone-acetaminophen 7.5-325 MG tablet Commonly known as: NORCO   meloxicam 15 MG tablet Commonly known as: MOBIC   naproxen sodium 220 MG tablet Commonly known as: ALEVE     TAKE these medications   aspirin 81 MG chewable tablet Commonly known as: Aspirin Childrens Chew 1 tablet (81 mg total) by mouth 2 (two) times daily. Take for 4 weeks, then resume regular dose.   cholecalciferol 25 MCG (1000 UT) tablet Commonly known as: VITAMIN D3 Take 1,000 Units by mouth 4 (four) times a week.   docusate sodium 100 MG capsule Commonly known as: Colace Take 1 capsule (100 mg total) by mouth 2 (two) times daily.   ferrous sulfate 325 (65 FE) MG tablet Commonly known as: FerrouSul Take 1 tablet (325 mg total) by mouth 3 (three) times daily with meals for 14 days.   methocarbamol 500 MG tablet Commonly known as: Robaxin Take 1 tablet (500 mg total) by mouth every 6 (six) hours as needed for muscle spasms.   Oxycodone HCl 10 MG Tabs Take 1-2 tablets (10-20 mg total) by mouth every 4 (four) hours as needed for moderate pain or severe pain.   polyethylene glycol 17 g packet Commonly known as: MIRALAX / GLYCOLAX Take 17 g by mouth 2 (two) times daily.   vitamin B-12 1000 MCG tablet Commonly known as: CYANOCOBALAMIN Take 1,000 mcg by mouth 4 (four) times a week.            Discharge Care Instructions  (From admission, onward)         Start     Ordered   08/02/19 0000  Change dressing    Comments: Maintain surgical dressing until follow up in the clinic. If the edges start to pull up, may reinforce with tape. If the dressing is no longer working, may remove and cover with gauze and  tape, but must keep the area dry and clean.  Call with any questions or concerns.   08/02/19 1244         Follow-up Information    Paralee Cancel, MD. Schedule an appointment as soon as possible for a visit in 2 weeks.   Specialty: Orthopedic Surgery Contact information: [address] [place] [address] [phone]           Signed: Mohammed Kindle Office:  [phone]

## 2019-08-04 NOTE — Progress Notes (Signed)
Subjective: 3 Days Post-Op Procedure(s) (LRB): TOTAL KNEE REVISION (Right)  Patient reports pain as mild to moderate.  Up and dressed in her clothes and ready to go home.  Tolerating POs well.  Admits to flatus.  Denies fever, chills, N/V, CP, SOB.  States that she has worked well with therapy.  Objective:   VITALS:  Temp:  [98.6 F (37 C)] 98.6 F (37 C) (10/25 0543) Pulse Rate:  [78-87] 78 (10/25 0543) Resp:  [14-16] 14 (10/25 0543) BP: (99-112)/(52-67) 112/67 (10/25 0543) SpO2:  [99 %-100 %] 100 % (10/25 0543)  General: WDWN patient in NAD. Psych:  Appropriate mood and affect. Neuro:  A&O x 3, Moving all extremities, sensation intact to light touch HEENT:  EOMs intact Chest:  Even non-labored respirations Skin:  Dressing C/D/I, no rashes or lesions Extremities: warm/dry, mild edema, no erythema or echymosis.  No lymphadenopathy. Pulses: Popliteus 2+ MSK:  ROM: lacks 5 degrees TKE, MMT: able to perform quad set, (-) Homan's    LABS Recent Labs    08/02/19 0248 08/03/19 0333  HGB 8.8* 7.9*  WBC 9.7 8.7  PLT 271 223   Recent Labs    08/02/19 0248 08/03/19 0333  NA 135 136  K 3.9 4.2  CL 102 101  CO2 25 27  BUN 16 16  CREATININE 0.47 0.60  GLUCOSE 102* 92   No results for input(s): LABPT, INR in the last 72 hours.   Assessment/Plan: 3 Days Post-Op Procedure(s) (LRB): TOTAL KNEE REVISION (Right)  Patient seen in rounds for Dr. Alvan Dame  [facility] R LE with rolling walker  D/C home after working with therapy today.  Scripts sent to pharmacy.  ASA for DVT ppx.  Mechele Claude PA-C [facility] Office:  [phone]

## 2019-08-04 NOTE — Progress Notes (Signed)
Pt was provided with d/c instructions. After discussing the pt's plan of care upon d/c home, the pt reported no further questions or concerns.  

## 2019-08-04 NOTE — Progress Notes (Signed)
Physical Therapy Treatment Patient Details Name: Latasha Snyder MRN: [id number] DOB: 10/03/1976 Today's Date: 08/04/2019    History of Present Illness Pt s/p R TKR revision and with hx of bil TKR 2011    PT Comments    Pt continues to progress steadily with mobility including negotiating stairs.  Pt reporting increasing back pain and noted to struggle to bring R LE up onto step - pt now with leg length discrepancy with surgical LE being straight and L LE being significantly bowed.  Shoe placed on L foot and pt notes immediate improvement in posture and back discomfort.  Follow Up Recommendations  Follow surgeon's recommendation for DC plan and follow-up therapies     Equipment Recommendations  Rolling walker with 5" wheels    Recommendations for Other Services       Precautions / Restrictions Precautions Precautions: Knee Restrictions Weight Bearing Restrictions: Yes RLE Weight Bearing: Partial weight bearing RLE Partial Weight Bearing Percentage or Pounds: 50%    Mobility  Bed Mobility Overal bed mobility: Needs Assistance Bed Mobility: Supine to Sit       Sit to supine: Supervision   General bed mobility comments: cues for sequence and use of L LE to self assist; pt self assisting R LE with gait belt  Transfers Overall transfer level: Needs assistance Equipment used: Rolling walker (2 wheeled) Transfers: Sit to/from Stand Sit to Stand: Supervision         General transfer comment: pt self-cues for LE management and use of UEs to self assist  Ambulation/Gait Ambulation/Gait assistance: Min guard;Supervision Gait Distance (Feet): 60 Feet Assistive device: Rolling walker (2 wheeled) Gait Pattern/deviations: Step-to pattern;Decreased step length - right;Decreased step length - left;Shuffle;Trunk flexed;Antalgic Gait velocity: decr       Stairs Stairs: Yes Stairs assistance: Min assist Stair Management: One rail Right;Step to pattern;Forwards;With  crutches Number of Stairs: 4 General stair comments: 2 steps twice with cues for sequence and foot/crutch placement   Wheelchair Mobility    Modified Rankin (Stroke Patients Only)       Balance Overall balance assessment: Mild deficits observed, not formally tested                                          Cognition Arousal/Alertness: Awake/alert Behavior During Therapy: WFL for tasks assessed/performed Overall Cognitive Status: Within Functional Limits for tasks assessed                                        Exercises      General Comments        Pertinent Vitals/Pain Pain Assessment: 0-10 Pain Score: 6  Pain Location: R knee Pain Descriptors / Indicators: Aching;Sore Pain Intervention(s): Limited activity within patient's tolerance;Monitored during session;Premedicated before session;Ice applied    Home Living                      Prior Function            PT Goals (current goals can now be found in the care plan section) Acute Rehab PT Goals Patient Stated Goal: Regain IND PT Goal Formulation: With patient Time For Goal Achievement: 08/09/19 Potential to Achieve Goals: Good Progress towards PT goals: Progressing toward goals    Frequency    7X/week  PT Plan Current plan remains appropriate    Co-evaluation              AM-PAC PT "6 Clicks" Mobility   Outcome Measure  Help needed turning from your back to your side while in a flat bed without using bedrails?: A Little Help needed moving from lying on your back to sitting on the side of a flat bed without using bedrails?: A Little Help needed moving to and from a bed to a chair (including a wheelchair)?: A Little Help needed standing up from a chair using your arms (e.g., wheelchair or bedside chair)?: A Little Help needed to walk in hospital room?: A Little Help needed climbing 3-5 steps with a railing? : A Little 6 Click Score: 18    End  of Session Equipment Utilized During Treatment: Gait belt Activity Tolerance: Patient tolerated treatment well Patient left: in bed;with call bell/phone within reach;with nursing/sitter in room Nurse Communication: Mobility status PT Visit Diagnosis: Difficulty in walking, not elsewhere classified (R26.2)     Time: 4196-2229 PT Time Calculation (min) (ACUTE ONLY): 26 min  Charges:  $Gait Training: 23-37 mins                     Mauro Kaufmann PT Acute Rehabilitation Services Pager [phone] Office [phone]    [name] 08/04/2019, 12:46 PM

## 2019-08-08 ENCOUNTER — Encounter (HOSPITAL_COMMUNITY): Payer: Self-pay | Admitting: Orthopedic Surgery

## 2019-08-30 ENCOUNTER — Other Ambulatory Visit: Payer: Self-pay

## 2019-08-30 ENCOUNTER — Inpatient Hospital Stay: Payer: Medicare Other | Attending: Oncology | Admitting: Oncology

## 2019-08-30 ENCOUNTER — Inpatient Hospital Stay: Payer: Medicare Other

## 2019-08-30 VITALS — BP 130/85 | HR 62 | Temp 98.0°F | Resp 17 | Ht 61.0 in | Wt 232.3 lb

## 2019-08-30 DIAGNOSIS — D509 Iron deficiency anemia, unspecified: Secondary | ICD-10-CM

## 2019-08-30 DIAGNOSIS — N920 Excessive and frequent menstruation with regular cycle: Secondary | ICD-10-CM | POA: Insufficient documentation

## 2019-08-30 DIAGNOSIS — D500 Iron deficiency anemia secondary to blood loss (chronic): Secondary | ICD-10-CM | POA: Insufficient documentation

## 2019-08-30 DIAGNOSIS — D649 Anemia, unspecified: Secondary | ICD-10-CM | POA: Diagnosis not present

## 2019-08-30 LAB — CBC WITH DIFFERENTIAL (CANCER CENTER ONLY)
Abs Immature Granulocytes: 0.01 10*3/uL (ref 0.00–0.07)
Basophils Absolute: 0.1 10*3/uL (ref 0.0–0.1)
Basophils Relative: 1 %
Eosinophils Absolute: 0.3 10*3/uL (ref 0.0–0.5)
Eosinophils Relative: 5 %
HCT: 33.7 % — ABNORMAL LOW (ref 36.0–46.0)
Hemoglobin: 10.4 g/dL — ABNORMAL LOW (ref 12.0–15.0)
Immature Granulocytes: 0 %
Lymphocytes Relative: 50 %
Lymphs Abs: 2.5 10*3/uL (ref 0.7–4.0)
MCH: 26.9 pg (ref 26.0–34.0)
MCHC: 30.9 g/dL (ref 30.0–36.0)
MCV: 87.1 fL (ref 80.0–100.0)
Monocytes Absolute: 0.3 10*3/uL (ref 0.1–1.0)
Monocytes Relative: 7 %
Neutro Abs: 1.8 10*3/uL (ref 1.7–7.7)
Neutrophils Relative %: 37 %
Platelet Count: 337 10*3/uL (ref 150–400)
RBC: 3.87 MIL/uL (ref 3.87–5.11)
RDW: 15.6 % — ABNORMAL HIGH (ref 11.5–15.5)
WBC Count: 5 10*3/uL (ref 4.0–10.5)
nRBC: 0 % (ref 0.0–0.2)

## 2019-08-30 NOTE — Progress Notes (Signed)
Hematology and Oncology Follow Up Visit  Latasha Snyder [id number] [birth year] 43 y.o. 08/30/2019 1:53 PM Latasha Snyder, MDNo ref. provider found   Principle Diagnosis: 43 year old woman with anemia diagnosed in March 2020.  She was found to have iron deficiency anemia related to menstrual blood losses .    Prior Therapy: Feraheme infusion for total 1000 mg given in March 2020.  Current therapy: Received to iron infusion as needed.  Interim History: Latasha Snyder for a follow-up visit.  Since the last visit, she was hospitalized with increased knee pain and underwent total knee revision on August 02, 2019 under the care of Dr. Constance Goltz.  Upon her hospitalization her hemoglobin was 11.8 but was decreased to 7.9 on her discharge on August 03, 2019.  Since her discharge, she has been taking oral iron supplements although she is reporting dyspepsia as well as constipation at times.  She is improving in her overall mobility although she still has some right knee pain.   Patient denied headaches, blurry vision, syncope or seizures.  Denies any fevers, chills or sweats.  Denied chest pain, palpitation, orthopnea or leg edema.  Denied cough, wheezing or hemoptysis.  Denied nausea, vomiting or abdominal pain.  Denies any constipation or diarrhea.  Denies any frequency urgency or hesitancy.  Denies any arthralgias or myalgias.  Denies any skin rashes or lesions.  Denies any bleeding or clotting tendency.  Denies any easy bruising.  Denies any hair or nail changes.  Denies any anxiety or depression.  Remaining review of system is negative.        Medications: Updated without any changes. Current Outpatient Medications  Medication Sig Dispense Refill  . aspirin (ASPIRIN CHILDRENS) 81 MG chewable tablet Chew 1 tablet (81 mg total) by mouth 2 (two) times daily. Take for 4 weeks, then resume regular dose. 60 tablet 0  . cholecalciferol (VITAMIN D3) 25 MCG (1000 UT) tablet Take 1,000 Units by mouth 4 (four)  times a week.    . docusate sodium (COLACE) 100 MG capsule Take 1 capsule (100 mg total) by mouth 2 (two) times daily. 28 capsule 0  . ferrous sulfate (FERROUSUL) 325 (65 FE) MG tablet Take 1 tablet (325 mg total) by mouth 3 (three) times daily with meals for 14 days. 42 tablet 0  . methocarbamol (ROBAXIN) 500 MG tablet Take 1 tablet (500 mg total) by mouth every 6 (six) hours as needed for muscle spasms. 40 tablet 0  . oxyCODONE 10 MG TABS Take 1-2 tablets (10-20 mg total) by mouth every 4 (four) hours as needed for moderate pain or severe pain. 60 tablet 0  . polyethylene glycol (MIRALAX / GLYCOLAX) 17 g packet Take 17 g by mouth 2 (two) times daily. 28 packet 0  . vitamin B-12 (CYANOCOBALAMIN) 1000 MCG tablet Take 1,000 mcg by mouth 4 (four) times a week.     No current facility-administered medications for this visit.      Allergies: No Known Allergies  Past Medical History, Surgical history, Social history, and Family History reviewed without any changes.    Physical Exam:  Blood pressure 130/85, pulse 62, temperature 98 F (36.7 C), temperature source Temporal, resp. rate 17, height 5\' 1"  (1.549 m), weight 232 lb 4.8 oz (105.4 kg), SpO2 96 %.   ECOG: 0     General appearance: Alert, awake without any distress. Head: Atraumatic without abnormalities Oropharynx: Without any thrush or ulcers. Eyes: No scleral icterus. Lymph nodes: No lymphadenopathy noted in the cervical,  supraclavicular, or axillary nodes Heart:regular rate and rhythm, without any murmurs or gallops.   Lung: Clear to auscultation without any rhonchi, wheezes or dullness to percussion. Abdomin: Soft, nontender without any shifting dullness or ascites. Musculoskeletal: No clubbing or cyanosis. Neurological: No motor or sensory deficits. Skin: No rashes or lesions.      Lab Results: Lab Results  Component Value Date   WBC 8.7 08/03/2019   HGB 7.9 (L) 08/03/2019   HCT 25.7 (L) 08/03/2019   MCV 88.3  08/03/2019   PLT 223 08/03/2019     Chemistry      Component Value Date/Time   NA 136 08/03/2019 0333   K 4.2 08/03/2019 0333   CL 101 08/03/2019 0333   CO2 27 08/03/2019 0333   BUN 16 08/03/2019 0333   CREATININE 0.60 08/03/2019 0333      Component Value Date/Time   CALCIUM 8.1 (L) 08/03/2019 0333       Impression and Plan:  43 year old woman with the:  1.    Anemia dating back to 2011 related to iron deficiency that was diagnosed again in March 2020.  He status post IV iron infusion with normalization of her iron studies in July 2020.   Her hemoglobin today is improving although she still has some evidence of microcytosis as well as elevated RDW indicating residual iron deficiency.  Risks and benefits of retreatment with Feraheme infusion was discussed.  She is becoming intolerant to oral iron and would benefit from intravenous iron.  These complications include arthralgias, myalgias and infusion related complications.  After discussion today she is agreeable to proceed.    2.  Follow-up: She will return in the next few weeks for IV iron infusion and MD follow-up in 4 months.   15  minutes was spent with the patient face-to-face today.  More than 50% of time was dedicated to reviewing her laboratory data, treatment options as well as coordinating future plan of care.      Zola Button, MD 11/20/20201:53 PM

## 2019-09-02 ENCOUNTER — Telehealth: Payer: Self-pay | Admitting: Oncology

## 2019-09-02 LAB — IRON AND TIBC
Iron: 28 ug/dL — ABNORMAL LOW (ref 41–142)
Saturation Ratios: 11 % — ABNORMAL LOW (ref 21–57)
TIBC: 259 ug/dL (ref 236–444)
UIBC: 231 ug/dL (ref 120–384)

## 2019-09-02 LAB — FERRITIN: Ferritin: 121 ng/mL (ref 11–307)

## 2019-09-02 NOTE — Telephone Encounter (Signed)
Scheduled appt per 11/20 los.  Was not able to get in contact with the pt and was not able to leave a voice mail of the scheduled appt dates and time.

## 2019-09-06 ENCOUNTER — Other Ambulatory Visit: Payer: Self-pay

## 2019-09-06 ENCOUNTER — Inpatient Hospital Stay: Payer: Medicare Other

## 2019-09-06 VITALS — BP 149/97 | HR 91 | Temp 97.8°F | Resp 16

## 2019-09-06 DIAGNOSIS — D500 Iron deficiency anemia secondary to blood loss (chronic): Secondary | ICD-10-CM | POA: Diagnosis not present

## 2019-09-06 DIAGNOSIS — D649 Anemia, unspecified: Secondary | ICD-10-CM

## 2019-09-06 MED ORDER — SODIUM CHLORIDE 0.9 % IV SOLN
Freq: Once | INTRAVENOUS | Status: AC
  Administered 2019-09-06: 16:00:00 via INTRAVENOUS
  Filled 2019-09-06: qty 250

## 2019-09-06 MED ORDER — SODIUM CHLORIDE 0.9 % IV SOLN
510.0000 mg | Freq: Once | INTRAVENOUS | Status: AC
  Administered 2019-09-06: 510 mg via INTRAVENOUS
  Filled 2019-09-06: qty 510

## 2019-09-06 NOTE — Patient Instructions (Signed)

## 2019-09-06 NOTE — Progress Notes (Signed)
Pt declined to stay for 30 minute post feraheme observation, VS stable.

## 2019-09-12 ENCOUNTER — Telehealth: Payer: Self-pay | Admitting: Oncology

## 2019-09-12 NOTE — Telephone Encounter (Signed)
Returned patient's phone call regarding rescheduling 12/04 appointment, per patient's request appointment has moved to 12/11.   Message to provider.

## 2019-09-13 ENCOUNTER — Inpatient Hospital Stay: Payer: Medicare Other

## 2019-09-13 NOTE — Patient Instructions (Addendum)
DUE TO COVID-19 ONLY ONE VISITOR IS ALLOWED TO COME WITH YOU AND STAY IN THE WAITING ROOM ONLY DURING PRE OP AND PROCEDURE DAY OF SURGERY. THE 1 VISITOR MAY VISIT WITH YOU AFTER SURGERY IN YOUR PRIVATE ROOM DURING VISITING HOURS ONLY!  YOU NEED TO HAVE A COVID 19 TEST ON: 09/16/2019, THIS TEST MUST BE DONE BEFORE SURGERY, COME  [address], [place] , [phone].  [facility]) ONCE YOUR COVID TEST IS COMPLETED, PLEASE BEGIN THE QUARANTINE INSTRUCTIONS AS OUTLINED IN YOUR HANDOUT.                 Latasha Snyder    Your procedure is scheduled on: 09/19/2019   [facility] Main  Entrance   Report to short stay at 5:30 AM     Call this number if you have problems the morning of surgery [phone]    Remember: NO SOLID FOOD AFTER MIDNIGHT THE NIGHT PRIOR TO SURGERY. NOTHING BY MOUTH EXCEPT CLEAR LIQUIDS UNTIL: 4:15 AM. PLEASE FINISH ENSURE DRINK PER SURGEON ORDER  WHICH NEEDS TO BE COMPLETED AT : 4:15 AM   CLEAR LIQUID DIET   Foods Allowed                                                                     Foods Excluded  Coffee and tea, regular and decaf                             liquids that you cannot  Plain Jell-O any favor except red or purple                                           see through such as: Fruit ices (not with fruit pulp)                                     milk, soups, orange juice  Iced Popsicles                                    All solid food Carbonated beverages, regular and diet                                    Cranberry, grape and apple juices Sports drinks like Gatorade Lightly seasoned clear broth or consume(fat free) Sugar, honey syrup  Sample Menu Breakfast                                Lunch                                     Supper Cranberry juice  Beef broth                            Chicken broth Jell-O                                     Grape juice                           Apple  juice Coffee or tea                        Jell-O                                      Popsicle                                                Coffee or tea                        Coffee or tea    BRUSH YOUR TEETH MORNING OF SURGERY AND RINSE YOUR MOUTH OUT, NO CHEWING GUM CANDY OR MINTS.     Take these medicines the morning of surgery with A SIP OF WATER: hydrocodone if needed     _____________________________________________________________________      Bonita Quin may not have any metal on your body including hair pins and              piercings  Do not wear jewelry, make-up, lotions, powders or perfumes, deodorant             Do not wear nail polish on your fingernails.  Do not shave  48 hours prior to surgery.               Do not bring valuables to the hospital. [facility] IS NOT             RESPONSIBLE   FOR VALUABLES.  Contacts, dentures or bridgework may not be worn into surgery.  Leave suitcase in the car. After surgery it may be brought to your room.     [facility] - Preparing for Surgery Before surgery, you can play an important role.  Because skin is not sterile, your skin needs to be as free of germs as possible.  You can reduce the number of germs on your skin by washing with CHG (chlorahexidine gluconate) soap before surgery.  CHG is an antiseptic cleaner which kills germs and bonds with the skin to continue killing germs even after washing. Please DO NOT use if you have an allergy to CHG or antibacterial soaps.  If your skin becomes reddened/irritated stop using the CHG and inform your nurse when you arrive at Short Stay. Do not shave (including legs and underarms) for at least 48 hours prior to the first CHG shower.  You may shave your face/neck. Please follow these instructions carefully:  1.  Shower with CHG Soap the night before surgery and the  morning of Surgery.  2.  If you choose to wash your hair, wash your hair first as usual with your  normal  shampoo.  3.   After you shampoo, rinse your hair and body thoroughly to remove the  shampoo.                           4.  Use CHG as you would any other liquid soap.  You can apply chg directly  to the skin and wash                       Gently with a scrungie or clean washcloth.  5.  Apply the CHG Soap to your body ONLY FROM THE NECK DOWN.   Do not use on face/ open                           Wound or open sores. Avoid contact with eyes, ears mouth and genitals (private parts).                       Wash face,  Genitals (private parts) with your normal soap.             6.  Wash thoroughly, paying special attention to the area where your surgery  will be performed.  7.  Thoroughly rinse your body with warm water from the neck down.  8.  DO NOT shower/wash with your normal soap after using and rinsing off  the CHG Soap.                9.  Pat yourself dry with a clean towel.            10.  Wear clean pajamas.            11.  Place clean sheets on your bed the night of your first shower and do not  sleep with pets. Day of Surgery : Do not apply any lotions/deodorants the morning of surgery.  Please wear clean clothes to the hospital/surgery center.  FAILURE TO FOLLOW THESE INSTRUCTIONS MAY RESULT IN THE CANCELLATION OF YOUR SURGERY PATIENT SIGNATURE_________________________________  NURSE SIGNATURE__________________________________  ________________________________________________________________________   Rogelia Mire  An incentive spirometer is a tool that can help keep your lungs clear and active. This tool measures how well you are filling your lungs with each breath. Taking long deep breaths may help reverse or decrease the chance of developing breathing (pulmonary) problems (especially infection) following:  A long period of time when you are unable to move or be active. BEFORE THE PROCEDURE   If the spirometer includes an indicator to show your best effort, your nurse or respiratory  therapist will set it to a desired goal.  If possible, sit up straight or lean slightly forward. Try not to slouch.  Hold the incentive spirometer in an upright position. INSTRUCTIONS FOR USE  1. Sit on the edge of your bed if possible, or sit up as far as you can in bed or on a chair. 2. Hold the incentive spirometer in an upright position. 3. Breathe out normally. 4. Place the mouthpiece in your mouth and seal your lips tightly around it. 5. Breathe in slowly and as deeply as possible, raising the piston or the ball toward the top of the column. 6. Hold your breath for 3-5 seconds or for as long as possible. Allow the piston or ball to fall to the bottom of the column. 7. Remove the mouthpiece from your mouth and  breathe out normally. 8. Rest for a few seconds and repeat Steps 1 through 7 at least 10 times every 1-2 hours when you are awake. Take your time and take a few normal breaths between deep breaths. 9. The spirometer may include an indicator to show your best effort. Use the indicator as a goal to work toward during each repetition. 10. After each set of 10 deep breaths, practice coughing to be sure your lungs are clear. If you have an incision (the cut made at the time of surgery), support your incision when coughing by placing a pillow or rolled up towels firmly against it. Once you are able to get out of bed, walk around indoors and cough well. You may stop using the incentive spirometer when instructed by your caregiver.  RISKS AND COMPLICATIONS  Take your time so you do not get dizzy or light-headed.  If you are in pain, you may need to take or ask for pain medication before doing incentive spirometry. It is harder to take a deep breath if you are having pain. AFTER USE  Rest and breathe slowly and easily.  It can be helpful to keep track of a log of your progress. Your caregiver can provide you with a simple table to help with this. If you are using the spirometer at home,  follow these instructions: SEEK MEDICAL CARE IF:   You are having difficultly using the spirometer.  You have trouble using the spirometer as often as instructed.  Your pain medication is not giving enough relief while using the spirometer.  You develop fever of 100.5 F (38.1 C) or higher. SEEK IMMEDIATE MEDICAL CARE IF:   You cough up bloody sputum that had not been present before.  You develop fever of 102 F (38.9 C) or greater.  You develop worsening pain at or near the incision site. MAKE SURE YOU:   Understand these instructions.  Will watch your condition.  Will get help right away if you are not doing well or get worse. Document Released: 02/06/2007 Document Revised: 12/19/2011 Document Reviewed: 04/09/2007 Exit[employer] Patient Information 2014 ExitCare, [place].   ________________________________________________________________________  _____________________________________________________________________     WHAT IS A BLOOD TRANSFUSION? Blood Transfusion Information  A transfusion is the replacement of blood or some of its parts. Blood is made up of multiple cells which provide different functions.  Red blood cells carry oxygen and are used for blood loss replacement.  White blood cells fight against infection.  Platelets control bleeding.  Plasma helps clot blood.  Other blood products are available for specialized needs, such as hemophilia or other clotting disorders. BEFORE THE TRANSFUSION  Who gives blood for transfusions?   Healthy volunteers who are fully evaluated to make sure their blood is safe. This is blood bank blood. Transfusion therapy is the safest it has ever been in the practice of medicine. Before blood is taken from a donor, a complete history is taken to make sure that person has no history of diseases nor engages in risky social behavior (examples are intravenous drug use or sexual activity with multiple partners). The donor's travel  history is screened to minimize risk of transmitting infections, such as malaria. The donated blood is tested for signs of infectious diseases, such as HIV and hepatitis. The blood is then tested to be sure it is compatible with you in order to minimize the chance of a transfusion reaction. If you or a relative donates blood, this is often done in anticipation of surgery  and is not appropriate for emergency situations. It takes many days to process the donated blood. RISKS AND COMPLICATIONS Although transfusion therapy is very safe and saves many lives, the main dangers of transfusion include:   Getting an infectious disease.  Developing a transfusion reaction. This is an allergic reaction to something in the blood you were given. Every precaution is taken to prevent this. The decision to have a blood transfusion has been considered carefully by your caregiver before blood is given. Blood is not given unless the benefits outweigh the risks. AFTER THE TRANSFUSION  Right after receiving a blood transfusion, you will usually feel much better and more energetic. This is especially true if your red blood cells have gotten low (anemic). The transfusion raises the level of the red blood cells which carry oxygen, and this usually causes an energy increase.  The nurse administering the transfusion will monitor you carefully for complications. HOME CARE INSTRUCTIONS  No special instructions are needed after a transfusion. You may find your energy is better. Speak with your caregiver about any limitations on activity for underlying diseases you may have. SEEK MEDICAL CARE IF:   Your condition is not improving after your transfusion.  You develop redness or irritation at the intravenous (IV) site. SEEK IMMEDIATE MEDICAL CARE IF:  Any of the following symptoms occur over the next 12 hours:  Shaking chills.  You have a temperature by mouth above 102 F (38.9 C), not controlled by medicine.  Chest,  back, or muscle pain.  People around you feel you are not acting correctly or are confused.  Shortness of breath or difficulty breathing.  Dizziness and fainting.  You get a rash or develop hives.  You have a decrease in urine output.  Your urine turns a dark color or changes to pink, red, or brown. Any of the following symptoms occur over the next 10 days:  You have a temperature by mouth above 102 F (38.9 C), not controlled by medicine.  Shortness of breath.  Weakness after normal activity.  The white part of the eye turns yellow (jaundice).  You have a decrease in the amount of urine or are urinating less often.  Your urine turns a dark color or changes to pink, red, or brown. Document Released: 09/23/2000 Document Revised: 12/19/2011 Document Reviewed: 05/12/2008 [facility] Patient Information 2014 [place], [place].  _______________________________________________________________________

## 2019-09-16 ENCOUNTER — Encounter (HOSPITAL_COMMUNITY): Payer: Self-pay

## 2019-09-16 ENCOUNTER — Other Ambulatory Visit (HOSPITAL_COMMUNITY)
Admission: RE | Admit: 2019-09-16 | Discharge: 2019-09-16 | Disposition: A | Discharge status: Home / Self Care | Payer: Medicare Other | Source: Ambulatory Visit | Attending: Orthopedic Surgery | Admitting: Orthopedic Surgery

## 2019-09-16 ENCOUNTER — Encounter (HOSPITAL_COMMUNITY)
Admission: RE | Admit: 2019-09-16 | Discharge: 2019-09-16 | Disposition: A | Discharge status: Home / Self Care | Payer: Medicare Other | Source: Ambulatory Visit | Attending: Orthopedic Surgery | Admitting: Orthopedic Surgery

## 2019-09-16 ENCOUNTER — Other Ambulatory Visit: Payer: Self-pay

## 2019-09-16 DIAGNOSIS — Z01812 Encounter for preprocedural laboratory examination: Secondary | ICD-10-CM | POA: Insufficient documentation

## 2019-09-16 DIAGNOSIS — Z20828 Contact with and (suspected) exposure to other viral communicable diseases: Secondary | ICD-10-CM | POA: Insufficient documentation

## 2019-09-16 DIAGNOSIS — T84093A Other mechanical complication of internal left knee prosthesis, initial encounter: Secondary | ICD-10-CM | POA: Insufficient documentation

## 2019-09-16 DIAGNOSIS — X58XXXA Exposure to other specified factors, initial encounter: Secondary | ICD-10-CM | POA: Insufficient documentation

## 2019-09-16 LAB — CBC
HCT: 37.9 % (ref 36.0–46.0)
Hemoglobin: 11.4 g/dL — ABNORMAL LOW (ref 12.0–15.0)
MCH: 26.3 pg (ref 26.0–34.0)
MCHC: 30.1 g/dL (ref 30.0–36.0)
MCV: 87.5 fL (ref 80.0–100.0)
Platelets: 297 10*3/uL (ref 150–400)
RBC: 4.33 MIL/uL (ref 3.87–5.11)
RDW: 16.6 % — ABNORMAL HIGH (ref 11.5–15.5)
WBC: 5.9 10*3/uL (ref 4.0–10.5)
nRBC: 0 % (ref 0.0–0.2)

## 2019-09-16 LAB — BASIC METABOLIC PANEL
Anion gap: 10 (ref 5–15)
BUN: 26 mg/dL — ABNORMAL HIGH (ref 6–20)
CO2: 25 mmol/L (ref 22–32)
Calcium: 9.1 mg/dL (ref 8.9–10.3)
Chloride: 102 mmol/L (ref 98–111)
Creatinine, Ser: 0.89 mg/dL (ref 0.44–1.00)
GFR calc Af Amer: >60 mL/min (ref >60)
GFR calc non Af Amer: >60 mL/min (ref >60)
Glucose, Bld: 78 mg/dL (ref 70–99)
Potassium: 4.5 mmol/L (ref 3.5–5.1)
Sodium: 137 mmol/L (ref 135–145)

## 2019-09-16 LAB — SURGICAL PCR SCREEN
MRSA, PCR: POSITIVE — AB
Staphylococcus aureus: POSITIVE — AB

## 2019-09-16 NOTE — Progress Notes (Signed)
PCP - Sandi Mariscal, MD Cardiologist -   Chest x-ray -  EKG - 07-24-2019 epic  Stress Test -  ECHO -  Cardiac Cath -   Sleep Study -  CPAP - no use since weight loss   Fasting Blood Sugar -  Checks Blood Sugar _____ times a day  Blood Thinner Instructions: Aspirin Instructions: Last Dose:  Anesthesia review:   Patient denies shortness of breath, fever, cough and chest pain at PAT appointment   Patient verbalized understanding of instructions that were given to them at the PAT appointment. Patient was also instructed that they will need to review over the PAT instructions again at home before surgery.

## 2019-09-17 LAB — NOVEL CORONAVIRUS, NAA (HOSP ORDER, SEND-OUT TO REF LAB; TAT 18-24 HRS): SARS-CoV-2, NAA: NOT DETECTED

## 2019-09-18 NOTE — Anesthesia Preprocedure Evaluation (Addendum)
Anesthesia Evaluation  Patient identified by MRN, date of birth, ID band Patient awake    Reviewed: Allergy & Precautions, NPO status , Patient's Chart, lab work & pertinent test results  History of Anesthesia Complications Negative for: history of anesthetic complications  Airway Mallampati: III  TM Distance: >3 FB Neck ROM: Full    Dental  (+) Teeth Intact   Pulmonary sleep apnea , former smoker,    Pulmonary exam normal        Cardiovascular hypertension, Normal cardiovascular exam     Neuro/Psych negative neurological ROS  negative psych ROS   GI/Hepatic negative GI ROS, Neg liver ROS,   Endo/Other  Morbid obesity  Renal/GU negative Renal ROS  negative genitourinary   Musculoskeletal  (+) Arthritis ,   Abdominal   Peds  Hematology negative hematology ROS (+)   Anesthesia Other Findings   Reproductive/Obstetrics                            Anesthesia Physical Anesthesia Plan  ASA: III  Anesthesia Plan: Spinal   Post-op Pain Management:    Induction:   PONV Risk Score and Plan: 2 and Propofol infusion, Treatment may vary due to age or medical condition, Ondansetron and TIVA  Airway Management Planned: Nasal Cannula and Simple Face Mask  Additional Equipment: None  Intra-op Plan:   Post-operative Plan:   Informed Consent: I have reviewed the patients History and Physical, chart, labs and discussed the procedure including the risks, benefits and alternatives for the proposed anesthesia with the patient or authorized representative who has indicated his/her understanding and acceptance.     Dental advisory given  Plan Discussed with:   Anesthesia Plan Comments:        Anesthesia Quick Evaluation

## 2019-09-18 NOTE — H&P (Signed)
TOTAL KNEE REVISION ADMISSION H&P  Patient is being admitted for left revision total knee arthroplasty.  Subjective:  Chief Complaint:  Failed left TKA  HPI: Latasha Snyder, 43 y.o. female, has a history of pain and functional disability in the left knee(s) due to failed previous arthroplasty and patient has failed non-surgical conservative treatments for greater than 12 weeks to include NSAID's and/or analgesics, supervised PT with diminished ADL's post treatment, use of assistive devices and activity modification. The indications for the revision of the total knee arthroplasty are loosening of one or more components and progressive or substantial perporsthetic bone loss. Onset of symptoms was gradual starting 9 years ago with gradually worsening course since that time.  Prior procedures on the left knee(s) include arthroplasty.  Patient currently rates pain in the left knee(s) at 10 out of 10 with activity. There is night pain, worsening of pain with activity and weight bearing, pain that interferes with activities of daily living, pain with passive range of motion and joint swelling.  Patient has evidence of prosthetic loosening by imaging studies. This condition presents safety issues increasing the risk of falls.  There is no current active infection.  Risks, benefits and expectations were discussed with the patient.  Risks including but not limited to the risk of anesthesia, blood clots, nerve damage, blood vessel damage, failure of the prosthesis, infection and up to and including death.  Patient understand the risks, benefits and expectations and wishes to proceed with surgery.   PCP: Salli Real, MD  D/C Plans:       Home  Post-op Meds:       No Rx given   Tranexamic Acid:      To be given - IV   Decadron:      Is to be given  FYI:      ASA  Oxycodone  DME:   Pt already has equipment   PT:   OPPT   Pharmacy: [facility] - [address], [place], [place] [address]     Patient Active  Problem List   Diagnosis Date Noted  . Morbid obesity (HCC) 08/02/2019  . 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 Anemia 12/28/2018  . OSA (obstructive sleep apnea) 09/11/2017  . Hypertension, essential 09/11/2017   Past Medical History:  Diagnosis Date  . Anemia   . Arthritis   . Hypertension   . Morbid obesity (HCC)   . Sleep apnea    mild no cpap and weight loss    Past Surgical History:  Procedure Laterality Date  . CHOLECYSTECTOMY    . KNEE SURGERY    . LAPAROSCOPIC GASTRIC SLEEVE RESECTION     06-2018  . TOTAL KNEE ARTHROPLASTY Bilateral   . TOTAL KNEE REVISION Right 08/01/2019   Procedure: TOTAL KNEE REVISION;  Surgeon: Durene Romans, MD;  Location: [facility] ORS;  Service: Orthopedics;  Laterality: Right;  90 mins  . TUBAL LIGATION      No current facility-administered medications for this encounter.    Current Outpatient Medications  Medication Sig Dispense Refill Last Dose  . cholecalciferol (VITAMIN D3) 25 MCG (1000 UT) tablet Take 1,000 Units by mouth 4 (four) times a week.     Marland Kitchen HYDROcodone-acetaminophen (NORCO) 7.5-325 MG tablet Take 1 tablet by mouth 4 (four) times daily as needed for pain.     . meloxicam (MOBIC) 15 MG tablet Take 15 mg by mouth daily.     . methocarbamol (ROBAXIN) 500 MG tablet Take 1 tablet (500 mg total)  by mouth every 6 (six) hours as needed for muscle spasms. 40 tablet 0   . polyethylene glycol powder (GLYCOLAX/MIRALAX) 17 GM/SCOOP powder Take 17 g by mouth daily as needed for constipation.     . docusate sodium (COLACE) 100 MG capsule Take 1 capsule (100 mg total) by mouth 2 (two) times daily. (Patient not taking: Reported on 09/11/2019) 28 capsule 0 Not Taking at Unknown time  . ferrous sulfate (FERROUSUL) 325 (65 FE) MG tablet Take 1 tablet (325 mg total) by mouth 3 (three) times daily with meals for 14 days. (Patient not taking: Reported on 09/11/2019) 42 tablet 0 Not Taking at Unknown time  . oxyCODONE 10 MG TABS Take 1-2 tablets (10-20 mg total)  by mouth every 4 (four) hours as needed for moderate pain or severe pain. (Patient not taking: Reported on 09/11/2019) 60 tablet 0 Not Taking at Unknown time   No Known Allergies   Social History   Tobacco Use  . Smoking status: Former Smoker    Years: 10.00  . Smokeless tobacco: Never Used  Substance Use Topics  . Alcohol use: Yes    Comment: socially       Review of Systems  Constitutional: Negative.   HENT: Negative.   Eyes: Negative.   Respiratory: Negative.   Cardiovascular: Negative.   Gastrointestinal: Negative.   Genitourinary: Negative.   Musculoskeletal: Positive for joint pain.  Skin: Negative.   Neurological: Negative.   Endo/Heme/Allergies: Negative.   Psychiatric/Behavioral: Negative.      Objective:  Physical Exam  Constitutional: She is oriented to person, place, and time. She appears well-developed.  HENT:  Head: Normocephalic.  Eyes: Pupils are equal, round, and reactive to light.  Neck: Neck supple. No JVD present. No tracheal deviation present. No thyromegaly present.  Cardiovascular: Normal rate, regular rhythm and intact distal pulses.  Respiratory: Effort normal and breath sounds normal. No respiratory distress. She has no wheezes.  GI: Soft. There is no abdominal tenderness. There is no guarding.  Musculoskeletal:     Left knee: She exhibits decreased range of motion, swelling, abnormal alignment and bony tenderness. She exhibits no laceration (healed previous incision) and no erythema. Tenderness found.  Lymphadenopathy:    She has no cervical adenopathy.  Neurological: She is alert and oriented to person, place, and time.  Skin: Skin is warm and dry.  Psychiatric: She has a normal mood and affect.      Labs:  Estimated body mass index is 43.89 kg/m as calculated from the following:   Height as of 08/30/19: 5\' 1"  (1.549 m).   Weight as of 08/30/19: 105.4 kg.  Imaging Review Plain radiographs demonstratefailure of the left knee(s).  The overall alignment is significant varus.There is evidence of failure of the femoral and tibial components. The bone quality appears to be good for age and reported activity level.     Assessment/Plan:  Left knee with failed previous arthroplasty.   The patient history, physical examination, clinical judgment of the provider and imaging studies are consistent with failure of the left knee(s), previous total knee arthroplasty. Revision total knee arthroplasty is deemed medically necessary. The treatment options including medical management, injection therapy, arthroscopy and revision arthroplasty were discussed at length. The risks and benefits of revision total knee arthroplasty were presented and reviewed. The risks due to aseptic loosening, infection, stiffness, patella tracking problems, thromboembolic complications and other imponderables were discussed. The patient acknowledged the explanation, agreed to proceed with the plan and consent was signed. Patient  is being admitted for inpatient treatment for surgery, pain control, PT, OT, prophylactic antibiotics, VTE prophylaxis, progressive ambulation and ADL's and discharge planning.The patient is planning to be discharged home.     Anastasio AuerbachMatthew S. Maday Guarino   PA-C  09/18/2019, 8:51 PM

## 2019-09-19 ENCOUNTER — Encounter (HOSPITAL_COMMUNITY): Payer: Self-pay | Admitting: Orthopedic Surgery

## 2019-09-19 ENCOUNTER — Encounter (HOSPITAL_COMMUNITY)
Admission: RE | Disposition: A | Discharge status: Home / Self Care | Payer: Self-pay | Source: Home / Self Care | Attending: Orthopedic Surgery

## 2019-09-19 ENCOUNTER — Inpatient Hospital Stay (HOSPITAL_COMMUNITY): Payer: Medicare Other | Admitting: Physician Assistant

## 2019-09-19 ENCOUNTER — Other Ambulatory Visit: Payer: Self-pay

## 2019-09-19 ENCOUNTER — Inpatient Hospital Stay (HOSPITAL_COMMUNITY)
Admission: RE | Admit: 2019-09-19 | Discharge: 2019-09-20 | DRG: 467 | Disposition: A | Discharge status: Home Health | Payer: Medicare Other | Attending: Orthopedic Surgery | Admitting: Orthopedic Surgery

## 2019-09-19 ENCOUNTER — Inpatient Hospital Stay (HOSPITAL_COMMUNITY): Payer: Medicare Other | Admitting: Anesthesiology

## 2019-09-19 DIAGNOSIS — T84093A Other mechanical complication of internal left knee prosthesis, initial encounter: Principal | ICD-10-CM | POA: Diagnosis present

## 2019-09-19 DIAGNOSIS — Z79899 Other long term (current) drug therapy: Secondary | ICD-10-CM

## 2019-09-19 DIAGNOSIS — Z79891 Long term (current) use of opiate analgesic: Secondary | ICD-10-CM | POA: Diagnosis not present

## 2019-09-19 DIAGNOSIS — Z20828 Contact with and (suspected) exposure to other viral communicable diseases: Secondary | ICD-10-CM | POA: Diagnosis present

## 2019-09-19 DIAGNOSIS — G4733 Obstructive sleep apnea (adult) (pediatric): Secondary | ICD-10-CM | POA: Diagnosis present

## 2019-09-19 DIAGNOSIS — Z96652 Presence of left artificial knee joint: Secondary | ICD-10-CM

## 2019-09-19 DIAGNOSIS — Z791 Long term (current) use of non-steroidal anti-inflammatories (NSAID): Secondary | ICD-10-CM | POA: Diagnosis not present

## 2019-09-19 DIAGNOSIS — Y792 Prosthetic and other implants, materials and accessory orthopedic devices associated with adverse incidents: Secondary | ICD-10-CM | POA: Diagnosis present

## 2019-09-19 DIAGNOSIS — Z87891 Personal history of nicotine dependence: Secondary | ICD-10-CM

## 2019-09-19 DIAGNOSIS — Z6841 Body Mass Index (BMI) 40.0 and over, adult: Secondary | ICD-10-CM

## 2019-09-19 DIAGNOSIS — I10 Essential (primary) hypertension: Secondary | ICD-10-CM | POA: Diagnosis present

## 2019-09-19 HISTORY — PX: TOTAL KNEE REVISION: SHX996

## 2019-09-19 LAB — TYPE AND SCREEN
ABO/RH(D): AB POS
Antibody Screen: NEGATIVE

## 2019-09-19 LAB — PREGNANCY, URINE: Preg Test, Ur: NEGATIVE

## 2019-09-19 SURGERY — TOTAL KNEE REVISION
Anesthesia: Spinal | Site: Knee | Laterality: Left

## 2019-09-19 MED ORDER — BUPIVACAINE HCL 0.25 % IJ SOLN
INTRAMUSCULAR | Status: DC | PRN
  Administered 2019-09-19: 30 mL

## 2019-09-19 MED ORDER — PHENYLEPHRINE HCL (PRESSORS) 10 MG/ML IV SOLN
INTRAVENOUS | Status: AC
  Filled 2019-09-19: qty 1

## 2019-09-19 MED ORDER — PROPOFOL 10 MG/ML IV BOLUS
INTRAVENOUS | Status: AC
  Filled 2019-09-19: qty 20

## 2019-09-19 MED ORDER — PROPOFOL 500 MG/50ML IV EMUL
INTRAVENOUS | Status: AC
  Filled 2019-09-19: qty 50

## 2019-09-19 MED ORDER — VANCOMYCIN HCL 1000 MG IV SOLR
INTRAVENOUS | Status: AC
  Filled 2019-09-19: qty 3000

## 2019-09-19 MED ORDER — OXYCODONE HCL 5 MG/5ML PO SOLN: 5.0000 mg | Freq: Once | ORAL | Status: DC | PRN

## 2019-09-19 MED ORDER — STERILE WATER FOR IRRIGATION IR SOLN
Status: DC | PRN
  Administered 2019-09-19: 2000 mL

## 2019-09-19 MED ORDER — TRANEXAMIC ACID-NACL 1000-0.7 MG/100ML-% IV SOLN
1000.0000 mg | Freq: Once | INTRAVENOUS | Status: AC
  Administered 2019-09-19: 1000 mg via INTRAVENOUS
  Filled 2019-09-19: qty 100

## 2019-09-19 MED ORDER — POVIDONE-IODINE 10 % EX SWAB
2.0000 "application " | Freq: Once | CUTANEOUS | Status: AC
  Administered 2019-09-19: 2 via TOPICAL

## 2019-09-19 MED ORDER — SODIUM CHLORIDE (PF) 0.9 % IJ SOLN
INTRAMUSCULAR | Status: DC | PRN
  Administered 2019-09-19: 30 mL

## 2019-09-19 MED ORDER — KETOROLAC TROMETHAMINE 30 MG/ML IJ SOLN
INTRAMUSCULAR | Status: DC | PRN
  Administered 2019-09-19: 30 mg

## 2019-09-19 MED ORDER — ACETAMINOPHEN 500 MG PO TABS
1000.0000 mg | ORAL_TABLET | Freq: Once | ORAL | Status: AC
  Administered 2019-09-19: 1000 mg via ORAL
  Filled 2019-09-19: qty 2

## 2019-09-19 MED ORDER — TOBRAMYCIN SULFATE 1.2 G IJ SOLR
INTRAMUSCULAR | Status: AC
  Filled 2019-09-19: qty 2.4

## 2019-09-19 MED ORDER — HYDROCODONE-ACETAMINOPHEN 5-325 MG PO TABS
1.0000 | ORAL_TABLET | ORAL | Status: DC | PRN
  Administered 2019-09-19: 2 via ORAL
  Filled 2019-09-19: qty 2

## 2019-09-19 MED ORDER — BISACODYL 10 MG RE SUPP: 10.0000 mg | Freq: Every day | RECTAL | Status: DC | PRN

## 2019-09-19 MED ORDER — BUPIVACAINE HCL (PF) 0.25 % IJ SOLN
INTRAMUSCULAR | Status: AC
  Filled 2019-09-19: qty 30

## 2019-09-19 MED ORDER — PHENYLEPHRINE HCL-NACL 10-0.9 MG/250ML-% IV SOLN
INTRAVENOUS | Status: DC | PRN
  Administered 2019-09-19: 25 ug/min via INTRAVENOUS

## 2019-09-19 MED ORDER — VANCOMYCIN HCL IN DEXTROSE 1-5 GM/200ML-% IV SOLN
1000.0000 mg | Freq: Once | INTRAVENOUS | Status: AC
  Administered 2019-09-19: 1000 mg via INTRAVENOUS
  Filled 2019-09-19: qty 200

## 2019-09-19 MED ORDER — CELECOXIB 200 MG PO CAPS
200.0000 mg | ORAL_CAPSULE | Freq: Two times a day (BID) | ORAL | Status: DC
  Administered 2019-09-19 – 2019-09-20 (×2): 200 mg via ORAL
  Filled 2019-09-19 (×2): qty 1

## 2019-09-19 MED ORDER — DEXAMETHASONE SODIUM PHOSPHATE 10 MG/ML IJ SOLN
10.0000 mg | Freq: Once | INTRAMUSCULAR | Status: AC
  Administered 2019-09-20: 10 mg via INTRAVENOUS
  Filled 2019-09-19: qty 1

## 2019-09-19 MED ORDER — OXYCODONE HCL 5 MG PO TABS: 5.0000 mg | ORAL_TABLET | Freq: Once | ORAL | Status: DC | PRN

## 2019-09-19 MED ORDER — SODIUM CHLORIDE 0.9 % IV SOLN
INTRAVENOUS | Status: DC
  Administered 2019-09-19: 13:00:00 via INTRAVENOUS

## 2019-09-19 MED ORDER — DOCUSATE SODIUM 100 MG PO CAPS
100.0000 mg | ORAL_CAPSULE | Freq: Two times a day (BID) | ORAL | Status: DC
  Administered 2019-09-19 – 2019-09-20 (×2): 100 mg via ORAL
  Filled 2019-09-19 (×2): qty 1

## 2019-09-19 MED ORDER — DEXAMETHASONE SODIUM PHOSPHATE 10 MG/ML IJ SOLN
10.0000 mg | Freq: Once | INTRAMUSCULAR | Status: AC
  Administered 2019-09-19: 08:00:00 10 mg via INTRAVENOUS

## 2019-09-19 MED ORDER — ALUM & MAG HYDROXIDE-SIMETH 200-200-20 MG/5ML PO SUSP: 15.0000 mL | ORAL | Status: DC | PRN

## 2019-09-19 MED ORDER — HYDROCODONE-ACETAMINOPHEN 7.5-325 MG PO TABS
1.0000 | ORAL_TABLET | ORAL | Status: DC | PRN
  Administered 2019-09-19 – 2019-09-20 (×4): 2 via ORAL
  Filled 2019-09-19 (×4): qty 2

## 2019-09-19 MED ORDER — ONDANSETRON HCL 4 MG/2ML IJ SOLN: 4.0000 mg | Freq: Once | INTRAMUSCULAR | Status: DC | PRN

## 2019-09-19 MED ORDER — PROPOFOL 500 MG/50ML IV EMUL
INTRAVENOUS | Status: DC | PRN
  Administered 2019-09-19: 50 ug/kg/min via INTRAVENOUS

## 2019-09-19 MED ORDER — SODIUM CHLORIDE 0.9 % IR SOLN
Status: DC | PRN
  Administered 2019-09-19 (×2): 1000 mL

## 2019-09-19 MED ORDER — CEFAZOLIN SODIUM-DEXTROSE 2-4 GM/100ML-% IV SOLN
2.0000 g | INTRAVENOUS | Status: AC
  Administered 2019-09-19: 08:00:00 2 g via INTRAVENOUS
  Filled 2019-09-19: qty 100

## 2019-09-19 MED ORDER — BUPIVACAINE IN DEXTROSE 0.75-8.25 % IT SOLN
INTRATHECAL | Status: DC | PRN
  Administered 2019-09-19: 2 mL via INTRATHECAL

## 2019-09-19 MED ORDER — ROPIVACAINE HCL 5 MG/ML IJ SOLN
INTRAMUSCULAR | Status: DC | PRN
  Administered 2019-09-19: 30 mL via PERINEURAL

## 2019-09-19 MED ORDER — MIDAZOLAM HCL 5 MG/5ML IJ SOLN
INTRAMUSCULAR | Status: DC | PRN
  Administered 2019-09-19: 2 mg via INTRAVENOUS

## 2019-09-19 MED ORDER — ACETAMINOPHEN 325 MG PO TABS: 325.0000 mg | ORAL_TABLET | Freq: Four times a day (QID) | ORAL | Status: DC | PRN

## 2019-09-19 MED ORDER — FENTANYL CITRATE (PF) 100 MCG/2ML IJ SOLN: 25.0000 ug | INTRAMUSCULAR | Status: DC | PRN

## 2019-09-19 MED ORDER — SODIUM CHLORIDE (PF) 0.9 % IJ SOLN
INTRAMUSCULAR | Status: AC
  Filled 2019-09-19: qty 50

## 2019-09-19 MED ORDER — DIPHENHYDRAMINE HCL 12.5 MG/5ML PO ELIX: 12.5000 mg | ORAL_SOLUTION | ORAL | Status: DC | PRN

## 2019-09-19 MED ORDER — MIDAZOLAM HCL 2 MG/2ML IJ SOLN
INTRAMUSCULAR | Status: AC
  Filled 2019-09-19: qty 2

## 2019-09-19 MED ORDER — ONDANSETRON HCL 4 MG/2ML IJ SOLN
INTRAMUSCULAR | Status: DC | PRN
  Administered 2019-09-19: 4 mg via INTRAVENOUS

## 2019-09-19 MED ORDER — METOCLOPRAMIDE HCL 5 MG/ML IJ SOLN: 5.0000 mg | Freq: Three times a day (TID) | INTRAMUSCULAR | Status: DC | PRN

## 2019-09-19 MED ORDER — PHENOL 1.4 % MT LIQD
1.0000 | OROMUCOSAL | Status: DC | PRN
  Filled 2019-09-19: qty 177

## 2019-09-19 MED ORDER — METHOCARBAMOL 500 MG PO TABS
500.0000 mg | ORAL_TABLET | Freq: Four times a day (QID) | ORAL | Status: DC | PRN
  Administered 2019-09-20 (×3): 500 mg via ORAL
  Filled 2019-09-19 (×3): qty 1

## 2019-09-19 MED ORDER — VANCOMYCIN HCL 1000 MG IV SOLR
INTRAVENOUS | Status: DC | PRN
  Administered 2019-09-19: 1000 mg

## 2019-09-19 MED ORDER — METOCLOPRAMIDE HCL 5 MG PO TABS: 5.0000 mg | ORAL_TABLET | Freq: Three times a day (TID) | ORAL | Status: DC | PRN

## 2019-09-19 MED ORDER — PROPOFOL 500 MG/50ML IV EMUL
INTRAVENOUS | Status: DC | PRN
  Administered 2019-09-19 (×3): 20 mg via INTRAVENOUS

## 2019-09-19 MED ORDER — CEFAZOLIN SODIUM-DEXTROSE 2-4 GM/100ML-% IV SOLN
2.0000 g | Freq: Four times a day (QID) | INTRAVENOUS | Status: AC
  Administered 2019-09-19 (×2): 2 g via INTRAVENOUS
  Filled 2019-09-19 (×3): qty 100

## 2019-09-19 MED ORDER — SODIUM CHLORIDE 0.9 % IR SOLN
Status: DC | PRN
  Administered 2019-09-19: 1000 mL

## 2019-09-19 MED ORDER — CHLORHEXIDINE GLUCONATE 4 % EX LIQD: 60.0000 mL | Freq: Once | CUTANEOUS | Status: DC

## 2019-09-19 MED ORDER — METHOCARBAMOL 500 MG IVPB - SIMPLE MED
500.0000 mg | Freq: Four times a day (QID) | INTRAVENOUS | Status: DC | PRN
  Filled 2019-09-19: qty 50

## 2019-09-19 MED ORDER — ONDANSETRON HCL 4 MG PO TABS: 4.0000 mg | ORAL_TABLET | Freq: Four times a day (QID) | ORAL | Status: DC | PRN

## 2019-09-19 MED ORDER — LACTATED RINGERS IV SOLN
INTRAVENOUS | Status: DC
  Administered 2019-09-19 (×2): via INTRAVENOUS

## 2019-09-19 MED ORDER — FENTANYL CITRATE (PF) 100 MCG/2ML IJ SOLN
INTRAMUSCULAR | Status: DC | PRN
  Administered 2019-09-19: 50 ug via INTRAVENOUS

## 2019-09-19 MED ORDER — ONDANSETRON HCL 4 MG/2ML IJ SOLN
INTRAMUSCULAR | Status: AC
  Filled 2019-09-19: qty 2

## 2019-09-19 MED ORDER — MENTHOL 3 MG MT LOZG: 1.0000 | LOZENGE | OROMUCOSAL | Status: DC | PRN

## 2019-09-19 MED ORDER — MAGNESIUM CITRATE PO SOLN: 1.0000 | Freq: Once | ORAL | Status: DC | PRN

## 2019-09-19 MED ORDER — DEXAMETHASONE SODIUM PHOSPHATE 10 MG/ML IJ SOLN
INTRAMUSCULAR | Status: AC
  Filled 2019-09-19: qty 1

## 2019-09-19 MED ORDER — FENTANYL CITRATE (PF) 100 MCG/2ML IJ SOLN
INTRAMUSCULAR | Status: AC
  Filled 2019-09-19: qty 2

## 2019-09-19 MED ORDER — ASPIRIN 81 MG PO CHEW
81.0000 mg | CHEWABLE_TABLET | Freq: Two times a day (BID) | ORAL | Status: DC
  Administered 2019-09-19 – 2019-09-20 (×2): 81 mg via ORAL
  Filled 2019-09-19 (×2): qty 1

## 2019-09-19 MED ORDER — TRANEXAMIC ACID-NACL 1000-0.7 MG/100ML-% IV SOLN
1000.0000 mg | INTRAVENOUS | Status: AC
  Administered 2019-09-19: 08:00:00 1000 mg via INTRAVENOUS
  Filled 2019-09-19: qty 100

## 2019-09-19 MED ORDER — POLYETHYLENE GLYCOL 3350 17 G PO PACK
17.0000 g | PACK | Freq: Two times a day (BID) | ORAL | Status: DC
  Administered 2019-09-19 – 2019-09-20 (×2): 17 g via ORAL
  Filled 2019-09-19 (×2): qty 1

## 2019-09-19 MED ORDER — ONDANSETRON HCL 4 MG/2ML IJ SOLN: 4.0000 mg | Freq: Four times a day (QID) | INTRAMUSCULAR | Status: DC | PRN

## 2019-09-19 MED ORDER — KETOROLAC TROMETHAMINE 30 MG/ML IJ SOLN
INTRAMUSCULAR | Status: AC
  Filled 2019-09-19: qty 1

## 2019-09-19 MED ORDER — MORPHINE SULFATE (PF) 2 MG/ML IV SOLN
0.5000 mg | INTRAVENOUS | Status: DC | PRN
  Administered 2019-09-19 – 2019-09-20 (×4): 1 mg via INTRAVENOUS
  Filled 2019-09-19 (×4): qty 1

## 2019-09-19 MED ORDER — FERROUS SULFATE 325 (65 FE) MG PO TABS
325.0000 mg | ORAL_TABLET | Freq: Two times a day (BID) | ORAL | Status: DC
  Administered 2019-09-20: 325 mg via ORAL
  Filled 2019-09-19: qty 1

## 2019-09-19 SURGICAL SUPPLY — 73 items
ADH SKN CLS APL DERMABOND .7 (GAUZE/BANDAGES/DRESSINGS) ×1
AUG FEM SZ4 8 REV POST STRL LF (Miscellaneous) ×2 IMPLANT
AUGMENT POST FEM SZ4 8 HIP (Miscellaneous) ×4 IMPLANT
BAG DECANTER FOR FLEXI CONT (MISCELLANEOUS) IMPLANT
BAG SPEC THK2 15X12 ZIP CLS (MISCELLANEOUS)
BAG ZIPLOCK 12X15 (MISCELLANEOUS) IMPLANT
BLADE SAW SGTL 11.0X1.19X90.0M (BLADE) ×2 IMPLANT
BLADE SAW SGTL 13.0X1.19X90.0M (BLADE) ×3 IMPLANT
BLADE SAW SGTL 81X20 HD (BLADE) ×3 IMPLANT
BLADE SURG SZ10 CARB STEEL (BLADE) ×6 IMPLANT
BNDG ELASTIC 6X5.8 VLCR STR LF (GAUZE/BANDAGES/DRESSINGS) ×3 IMPLANT
BRUSH FEMORAL CANAL (MISCELLANEOUS) ×2 IMPLANT
BSPLAT TIB 4 CMNT REV ROT PLAT (Knees) ×1 IMPLANT
CEMENT HV SMART SET (Cement) IMPLANT
COMP FEM ATTUNE CRS SZ4 LT (Femur) ×3 IMPLANT
COMPONENT FEM ATN CR SZ4 LT (Femur) IMPLANT
COVER SURGICAL LIGHT HANDLE (MISCELLANEOUS) ×3 IMPLANT
COVER WAND RF STERILE (DRAPES) IMPLANT
CUFF TOURN SGL QUICK 34 (TOURNIQUET CUFF) ×3
CUFF TRNQT CYL 34X4.125X (TOURNIQUET CUFF) ×1 IMPLANT
DECANTER SPIKE VIAL GLASS SM (MISCELLANEOUS) IMPLANT
DERMABOND ADVANCED (GAUZE/BANDAGES/DRESSINGS) ×2
DERMABOND ADVANCED .7 DNX12 (GAUZE/BANDAGES/DRESSINGS) ×1 IMPLANT
DRAPE U-SHAPE 47X51 STRL (DRAPES) ×3 IMPLANT
DRESSING AQUACEL AG SP 3.5X10 (GAUZE/BANDAGES/DRESSINGS) IMPLANT
DRSG AQUACEL AG ADV 3.5X10 (GAUZE/BANDAGES/DRESSINGS) ×3 IMPLANT
DRSG AQUACEL AG SP 3.5X10 (GAUZE/BANDAGES/DRESSINGS) ×3
DURAPREP 26ML APPLICATOR (WOUND CARE) ×6 IMPLANT
ELECT REM PT RETURN 15FT ADLT (MISCELLANEOUS) ×3 IMPLANT
GAUZE SPONGE 2X2 8PLY STRL LF (GAUZE/BANDAGES/DRESSINGS) IMPLANT
GLOVE BIOGEL PI IND STRL 7.5 (GLOVE) ×1 IMPLANT
GLOVE BIOGEL PI IND STRL 8.5 (GLOVE) ×1 IMPLANT
GLOVE BIOGEL PI INDICATOR 7.5 (GLOVE) ×2
GLOVE BIOGEL PI INDICATOR 8.5 (GLOVE)
GLOVE ECLIPSE 8.0 STRL XLNG CF (GLOVE) ×2 IMPLANT
GLOVE ORTHO TXT STRL SZ7.5 (GLOVE) ×3 IMPLANT
GOWN STRL REUS W/TWL 2XL LVL3 (GOWN DISPOSABLE) ×1 IMPLANT
GOWN STRL REUS W/TWL LRG LVL3 (GOWN DISPOSABLE) ×3 IMPLANT
GOWN STRL REUS W/TWL XL LVL3 (GOWN DISPOSABLE) IMPLANT
GRAFT DBM PARTICULATE CANC 10 (Bone Implant) IMPLANT
GRAFT IC CHAMBER MED (Bone Implant) ×6 IMPLANT
HANDPIECE INTERPULSE COAX TIP (DISPOSABLE) ×3
HOLDER FOLEY CATH W/STRAP (MISCELLANEOUS) IMPLANT
INSERT TIB CMT ATTUNE RP SZ4 (Knees) ×2 IMPLANT
INSERT TIB CRS ATTUNE SZ4 16 (Insert) ×2 IMPLANT
KIT TURNOVER KIT A (KITS) IMPLANT
MANIFOLD NEPTUNE II (INSTRUMENTS) ×3 IMPLANT
NDL SAFETY ECLIPSE 18X1.5 (NEEDLE) IMPLANT
NEEDLE HYPO 18GX1.5 SHARP (NEEDLE)
NS IRRIG 1000ML POUR BTL (IV SOLUTION) ×3 IMPLANT
PACK TOTAL KNEE CUSTOM (KITS) ×3 IMPLANT
PENCIL SMOKE EVACUATOR (MISCELLANEOUS) ×2 IMPLANT
PIN FIX SIGMA LCS THRD HI (PIN) ×2 IMPLANT
PROTECTOR NERVE ULNAR (MISCELLANEOUS) ×3 IMPLANT
SET HNDPC FAN SPRY TIP SCT (DISPOSABLE) ×1 IMPLANT
SET PAD KNEE POSITIONER (MISCELLANEOUS) ×3 IMPLANT
SLEEVE FEM POROCOAT 30 (Knees) ×2 IMPLANT
SLEEVE TIB ATTUNE FP 37 (Knees) ×2 IMPLANT
SPONGE GAUZE 2X2 STER 10/PKG (GAUZE/BANDAGES/DRESSINGS)
STAPLER VISISTAT 35W (STAPLE) IMPLANT
STEM STR ATTUNE PF 14X60 (Knees) ×2 IMPLANT
STEM STR ATTUNE PF 16X60 (Knees) ×2 IMPLANT
SUT MNCRL AB 3-0 PS2 18 (SUTURE) ×3 IMPLANT
SUT STRATAFIX PDS+ 0 24IN (SUTURE) ×3 IMPLANT
SUT VIC AB 1 CT1 36 (SUTURE) ×3 IMPLANT
SUT VIC AB 2-0 CT1 27 (SUTURE) ×9
SUT VIC AB 2-0 CT1 TAPERPNT 27 (SUTURE) ×3 IMPLANT
SYR 50ML LL SCALE MARK (SYRINGE) IMPLANT
TOWER CARTRIDGE SMART MIX (DISPOSABLE) ×3 IMPLANT
TUBE KAMVAC SUCTION (TUBING) IMPLANT
WATER STERILE IRR 1000ML POUR (IV SOLUTION) ×5 IMPLANT
WRAP KNEE MAXI GEL POST OP (GAUZE/BANDAGES/DRESSINGS) ×3 IMPLANT
YANKAUER SUCT BULB TIP 10FT TU (MISCELLANEOUS) IMPLANT

## 2019-09-19 NOTE — Anesthesia Procedure Notes (Signed)
Anesthesia Regional Block: Adductor canal block   Pre-Anesthetic Checklist: ,, timeout performed, Correct Patient, Correct Site, Correct Laterality, Correct Procedure, Correct Position, site marked, Risks and benefits discussed,  Surgical consent,  Pre-op evaluation,  At surgeon's request and post-op pain management  Laterality: Left  Prep: chloraprep       Needles:  Injection technique: Single-shot  Needle Type: Echogenic Stimulator Needle     Needle Length: 10cm  Needle Gauge: 21     Additional Needles:   Procedures:,,,, ultrasound used (permanent image in chart),,,,  Narrative:  Start time: 09/19/2019 7:00 AM End time: 09/19/2019 7:05 AM Injection made incrementally with aspirations every 5 mL.  Performed by: Personally  Anesthesiologist: Lidia Collum, MD  Additional Notes: Monitors applied. Injection made in 5cc increments. No resistance to injection. Good needle visualization. Patient tolerated procedure well.

## 2019-09-19 NOTE — Evaluation (Signed)
Physical Therapy Evaluation Patient Details Name: JERZY CROTTEAU MRN: [id number] DOB: 02-28-1976 Today's Date: 09/19/2019   History of Present Illness  Pt s/p L TKR revision and with hx of bil TKR 2011 and revision of R TKR 10/20  Clinical Impression  Pt s/p L TKR revision and presents with decreased L LE strength/ROM and post op pain limiting functional mobility.  Pt should progress to dc home with family assist.  Pt reports HHPT will be following her at home.    Follow Up Recommendations Follow surgeon's recommendation for DC plan and follow-up therapies    Equipment Recommendations  Rolling walker with 5" wheels    Recommendations for Other Services       Precautions / Restrictions Precautions Precautions: Knee Restrictions Weight Bearing Restrictions: No Other Position/Activity Restrictions: WBAT      Mobility  Bed Mobility Overal bed mobility: Needs Assistance Bed Mobility: Supine to Sit;Sit to Supine     Supine to sit: Min assist Sit to supine: Min assist   General bed mobility comments: cues for sequence and use of R LE to self assist; Physical assist to manage L LE  Transfers Overall transfer level: Needs assistance Equipment used: Rolling walker (2 wheeled) Transfers: Sit to/from Stand Sit to Stand: Min assist         General transfer comment: cues for LE management and use of UEs to self assist  Ambulation/Gait Ambulation/Gait assistance: Min assist Gait Distance (Feet): 8 Feet Assistive device: Rolling walker (2 wheeled) Gait Pattern/deviations: Step-to pattern;Decreased step length - right;Decreased step length - left;Shuffle;Trunk flexed;Antalgic Gait velocity: decr   General Gait Details: cues for sequence, posture, position from [employer]            Wheelchair Mobility    Modified Rankin (Stroke Patients Only)       Balance Overall balance assessment: Mild deficits observed, not formally tested                                            Pertinent Vitals/Pain Pain Assessment: 0-10 Pain Score: 7  Pain Location: R knee Pain Descriptors / Indicators: Aching;Sore Pain Intervention(s): Limited activity within patient's tolerance;Monitored during session;Premedicated before session;Ice applied    Home Living Family/patient expects to be discharged to:: Private residence Living Arrangements: Spouse/significant other;Children Available Help at Discharge: Family Type of Home: House Home Access: Stairs to enter Entrance Stairs-Rails: Left Entrance Stairs-Number of Steps: 4 Home Layout: One level Home Equipment: Cane - single point;Walker - 2 wheels      Prior Function Level of Independence: Independent with assistive device(s)         Comments: Using RW since R TKR revision in October     Hand Dominance        Extremity/Trunk Assessment   Upper Extremity Assessment Upper Extremity Assessment: Overall WFL for tasks assessed    Lower Extremity Assessment Lower Extremity Assessment: LLE deficits/detail    Cervical / Trunk Assessment Cervical / Trunk Assessment: Normal  Communication   Communication: No difficulties  Cognition Arousal/Alertness: Awake/alert Behavior During Therapy: WFL for tasks assessed/performed Overall Cognitive Status: Within Functional Limits for tasks assessed                                        General Comments  Exercises Total Joint Exercises Ankle Circles/Pumps: AROM;Both;20 reps;Supine   Assessment/Plan    PT Assessment Patient needs continued PT services  PT Problem List Decreased strength;Decreased range of motion;Decreased activity tolerance;Decreased mobility;Decreased knowledge of use of DME;Pain;Obesity       PT Treatment Interventions DME instruction;Gait training;Stair training;Functional mobility training;Therapeutic activities;Therapeutic exercise;Patient/family education    PT Goals (Current goals can be  found in the Care Plan section)  Acute Rehab PT Goals Patient Stated Goal: Regain IND PT Goal Formulation: With patient Time For Goal Achievement: 08/09/19 Potential to Achieve Goals: Good    Frequency 7X/week   Barriers to discharge        Co-evaluation               AM-PAC PT "6 Clicks" Mobility  Outcome Measure Help needed turning from your back to your side while in a flat bed without using bedrails?: A Little Help needed moving from lying on your back to sitting on the side of a flat bed without using bedrails?: A Little Help needed moving to and from a bed to a chair (including a wheelchair)?: A Little Help needed standing up from a chair using your arms (e.g., wheelchair or bedside chair)?: A Little Help needed to walk in hospital room?: A Little Help needed climbing 3-5 steps with a railing? : A Lot 6 Click Score: 17    End of Session Equipment Utilized During Treatment: Gait belt Activity Tolerance: Patient limited by fatigue Patient left: in bed;with call bell/phone within reach;with bed alarm set Nurse Communication: Mobility status PT Visit Diagnosis: Difficulty in walking, not elsewhere classified (R26.2)    Time: 7616-0737 PT Time Calculation (min) (ACUTE ONLY): 21 min   Charges:   PT Evaluation $PT Eval Low Complexity: 1 Low          Mauro Kaufmann PT Acute Rehabilitation Services Pager [phone] Office [phone]   Venna Berberich 09/19/2019, 5:39 PM

## 2019-09-19 NOTE — Discharge Instructions (Signed)

## 2019-09-19 NOTE — Op Note (Signed)
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  PROCEDURE:  Revision left total knee arthroplasty.  COMPONENTS USED:  DePuy Attune revision knee system with a size 4 left revision femoral component with a 30 mm fully porous coated femoral sleeve, 8 mm augments for medial and lateral posterior aspect of the femur, a 14 x 60 mm press-fit stem.  On the  tibia side, I used a size 4 Attune revision tray with a 37 mm fully porous coated sleeve, 16 x 60 press-fit stem and then a size 4 x 16 mm revision rotating platform insert.  SURGEON:  Durene Romans, MD  ASSISTANT:  Dennie Bible, PA-C.  Note that Ms. Adrian Blackwater was present for the entirety of the case from preoperative positioning, perioperative management of the operative extremity, general facilitation of the case and primary wound closure.  ANESTHESIA:  Regional plus spinal.  SPECIMENS:  None.  COMPLICATIONS:  None.  TOURNIQUET:  Up for 86 minutes at 250 mmHg.  DRAINS:  None.  ESTIMATED BLOOD LOSS:  Less than 100 mL.  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  She underwent bilateral staged total knee arthroplasties with me within the last 5 years.  Based on persistent varus load she failed her tibial component into varus with  resultant aseptic failure of both components.  About 7 weeks ago we revised her right knee.  She was ready to proceed with a left total knee revision based on the significant improvement seen thus far.  Consent was obtained after reviewing risks of  infection, DVT component failure again and need for revision surgery.  Consent was obtained  for benefit of pain relief.  PROCEDURE DETAIL:  The patient was brought to the operative theater.  Once adequate anesthesia and preoperative antibiotics including Ancef and vancomycin due to MRSA positive screen.  She was positioned supine with a left thigh tourniquet placed.  Left  lower extremity was prepped and draped in sterile fashion.  Timeout was performed identifying the patient, the planned procedure and extremity.  She was noted to have similar to her right leg significant hyperextension to the knee as well as significant  persistent varus.  The left lower extremity was exsanguinated, tourniquet elevated to 250 mmHg.  Her old incision was excised and extended proximal and distal for exposure purposes.  Median arthrotomy encountered a blood-tinged synovial fluid.  No signs  for infection.  Initial exposure of the proximal medial tibia as well as synovectomy in the medial, lateral and suprapatellar region as well as around the patella.  The patella was noted to be stable.  The knee was then flexed and the patella laterally  subluxed.  The femoral component basically just fell off.  I removed all remaining cement as well as nonviable tissue on the distal femur.  Once I completed this, we exposed the tibia with retractors placed.  The tibial component was removed without  challenge.  Again, remaining bone cement was removed.  At this point, attention was first directed to tibia.  Using extramedullary guide in position for a minimal cut off the lateral proximal tibia as there was already significant medial bone loss.  We  removed this bone as well as  remaining cement.  I then opened up the canal and reamed up to 16 mm on both the femoral and tibial side.  On the tibia side, I then broached initially with a starting broach and then to a 37 broach, which set very proud, but  I was unable to get it to go down further based on her anatomy.  The cut surface seemed to be best fit with a size 4 tibial tray  and was well aligned and the perpendicular plane.  We thus placed a trial 4 tibial tray with a 37 porous sleeve and stem.   Once this was completed, I then attended to the femur.  With the attempt at trying to utilize the jig for the distal femoral cut fresh up, there was no significant bone to even get the pins in there, so I just used a saw to remove some of the ending  sclerotic bone medially and laterally.  I then assessed and identified that I was going to at least need 4 mm augments laterally and a 12 mm augment medially.  We then placed the appropriate cutting block for the size 4 femur and revisited the anterior  and posterior cuts recognizing the need for 8 mm posterior augments.  The posterior and anterior chamfer cuts were made off the jig as well as revision and the box cut.  So initially I did a trial reduction with a distal femur with a 4 mm lateral and 12  mm medial augment as well as the stem.  When I did this with the tibial tray in place, I still found that there was a mismatch in extension to flexion even with a 16 mm insert in.  Given these findings, I needed to try to get some further distance on the  femur side, so I made a decision to go up to a press-fit sleeve.  At this point, I removed the femoral trial components and then used the broaches and carefully broached with a 30 mm broach to make this fit roughly measured at least an 8 mm off the  lateral distal femur and further medially.  I retrialed and found at this point that with this system and the 16 mm insert that the knee felt stable from extension into flexion without hyperextension.  Given all these findings, we removed all the trial  components.  I first removed the femoral component and removed the tibial tray off of the sleeve.  She was noted to have significant proximal medial tibial bone loss and for that reason, I opened 20 mL of allograft bone chips.  I then packed these into  the lateral proximal tibia deficit prior to  removing the trial sleeve and stem.  While I was doing this, we opened all the components on the back table.  Then under my supervision, the tibial component was constructed as well as the femoral component.   We did decide to use 8 mm augments medially and laterally on the posterior aspect of the femur component to maybe help with rotation.  However, there ended up being very limited contact.  Once the final components were configured and appropriately  positioned and tightened, I removed the tibial sleeve trial and stem and then impacted the tibial tray.  There was noted to be a small medial crack that was noncontinuous over the medial proximal tibia.  At that point, we stopped impaction.  I then  impacted the femoral component, which was based with a rotation of the femoral  component based on the trial position of the sleeve in relationship to the femoral component.  The knee was then brought to extension with a 16 mm insert again to make certain  it was fine as well as to assess the patella.  Please note that I did use pulse lavage irrigation at least 4 liters of normal saline solution with a canal brush irrigator for canals first prior to putting bone graft from the tibia and then the entire knee as well.  Once I assessed the range of motion  both in extension and flexion, we selected the 16 mm insert.  The final 16 mm insert to match the 4 femur revision insert was then placed in the tibial tray and the knee was reduced.  The tourniquet was let down after 86 minutes.  No significant  hemostasis required.  The extensor mechanism was then reapproximated with a combination of band in near neutral as well as about 30 degrees of flexion.  We used a combination of #1 Vicryl and #1 Stratafix suture.  The remaining wound was closed in layers  with 2-0 Vicryl and a running Monocryl stitch.  The knee was then cleaned, dried and dressed sterilely using surgical glue and Aquacel dressing.  She was then brought to  the recovery room in stable condition tolerating the procedure well.  Findings were  reviewed with her friend who will pass along to family.  Most likely she will be in the hospital overnight and be discharged tomorrow morning.  TN/NUANCE  D:09/19/2019 T:09/19/2019 JOB:[id number]

## 2019-09-19 NOTE — Interval H&P Note (Signed)
History and Physical Interval Note:  09/19/2019 6:38 AM  Latasha Snyder  has presented today for surgery, with the diagnosis of Failed left total knee.  The various methods of treatment have been discussed with the patient and family. After consideration of risks, benefits and other options for treatment, the patient has consented to  Procedure(s) with comments: TOTAL KNEE REVISION (Left) - 90 mins as a surgical intervention.  The patient's history has been reviewed, patient examined, no change in status, stable for surgery.  I have reviewed the patient's chart and labs.  Questions were answered to the patient's satisfaction.     Mauri Pole

## 2019-09-19 NOTE — Anesthesia Postprocedure Evaluation (Signed)
Anesthesia Post Note  Patient: Latasha Snyder  Procedure(s) Performed: TOTAL KNEE REVISION (Left Knee)     Patient location during evaluation: PACU Anesthesia Type: Spinal Level of consciousness: awake and alert and oriented Pain management: pain level controlled Vital Signs Assessment: post-procedure vital signs reviewed and stable Respiratory status: spontaneous breathing, nonlabored ventilation and respiratory function stable Cardiovascular status: blood pressure returned to baseline Postop Assessment: no apparent nausea or vomiting and spinal receding Anesthetic complications: no    Last Vitals:  Vitals:   09/19/19 1100 09/19/19 1137  BP: (!) 144/103 135/90  Pulse: (!) 58 70  Resp: 13   Temp:    SpO2: 100% 100%    Last Pain:  Vitals:   09/19/19 1109  TempSrc:   PainSc: 0-No pain                 Brennan Bailey

## 2019-09-19 NOTE — Brief Op Note (Signed)
09/19/2019  11:20 AM  PATIENT:  Latasha Snyder  43 y.o. female  PRE-OPERATIVE DIAGNOSIS:  Failed left total knee due aseptic failure  POST-OPERATIVE DIAGNOSIS:  Failed left total knee due aseptic failure  PROCEDURE:  Procedure(s) with comments: TOTAL KNEE REVISION (Left) - 90 mins  SURGEON:  Surgeon(s) and Role:    Paralee Cancel, MD - Primary  PHYSICIAN ASSISTANT: Griffith Citron, PA-C  ANESTHESIA:   regional and spinal  EBL:  25 mL   BLOOD ADMINISTERED:none  DRAINS: none   LOCAL MEDICATIONS USED:  MARCAINE     SPECIMEN:  No Specimen  DISPOSITION OF SPECIMEN:  N/A  COUNTS:  YES  TOURNIQUET:   Total Tourniquet Time Documented: Thigh (Left) - 86 minutes Total: Thigh (Left) - 86 minutes   DICTATION: .Other Dictation: Dictation Number [id number]  PLAN OF CARE: Admit to inpatient   PATIENT DISPOSITION:  PACU - hemodynamically stable.   Delay start of Pharmacological VTE agent (>24hrs) due to surgical blood loss or risk of bleeding: no

## 2019-09-19 NOTE — Transfer of Care (Signed)
Immediate Anesthesia Transfer of Care Note  Patient: Latasha Snyder  Procedure(s) Performed: TOTAL KNEE REVISION (Left Knee)  Patient Location: PACU  Anesthesia Type:MAC and Spinal  Level of Consciousness: drowsy, patient cooperative and responds to stimulation  Airway & Oxygen Therapy: Patient Spontanous Breathing and Patient connected to face mask oxygen  Post-op Assessment: Report given to RN and Post -op Vital signs reviewed and stable  Post vital signs: Reviewed and stable  Last Vitals:  Vitals Value Taken Time  BP    Temp    Pulse    Resp    SpO2      Last Pain:  Vitals:   09/19/19 0555  TempSrc: Oral         Complications: No apparent anesthesia complications

## 2019-09-19 NOTE — Anesthesia Procedure Notes (Signed)
Spinal  Patient location during procedure: OR Start time: 09/19/2019 7:28 AM End time: 09/19/2019 7:32 AM Staffing Performed: resident/CRNA  Anesthesiologist: Lidia Collum, MD Resident/CRNA: Glory Buff, CRNA Preanesthetic Checklist Completed: patient identified, IV checked, site marked, risks and benefits discussed, surgical consent, monitors and equipment checked, pre-op evaluation and timeout performed Spinal Block Patient position: sitting Prep: DuraPrep Patient monitoring: heart rate, cardiac monitor, continuous pulse ox and blood pressure Approach: midline Location: L3-4 Injection technique: single-shot Needle Needle type: Pencan  Needle gauge: 24 G Needle length: 9 cm Assessment Sensory level: T6 Additional Notes Kit expiration date checked and verified.  Sterile prep and drape, skin local with 1% lidocaine, - heme, - paraesthesia, + CSF pre and post injection, patient tolerated procedure well.

## 2019-09-19 NOTE — Anesthesia Procedure Notes (Signed)
Date/Time: 09/19/2019 7:25 AM Performed by: Glory Buff, CRNA Oxygen Delivery Method: Simple face mask

## 2019-09-20 ENCOUNTER — Inpatient Hospital Stay: Payer: Medicare Other

## 2019-09-20 ENCOUNTER — Encounter: Payer: Self-pay | Admitting: *Deleted

## 2019-09-20 LAB — CBC
HCT: 27 % — ABNORMAL LOW (ref 36.0–46.0)
Hemoglobin: 8.3 g/dL — ABNORMAL LOW (ref 12.0–15.0)
MCH: 27 pg (ref 26.0–34.0)
MCHC: 30.7 g/dL (ref 30.0–36.0)
MCV: 87.9 fL (ref 80.0–100.0)
Platelets: 206 10*3/uL (ref 150–400)
RBC: 3.07 MIL/uL — ABNORMAL LOW (ref 3.87–5.11)
RDW: 16.4 % — ABNORMAL HIGH (ref 11.5–15.5)
WBC: 8 10*3/uL (ref 4.0–10.5)
nRBC: 0 % (ref 0.0–0.2)

## 2019-09-20 LAB — BASIC METABOLIC PANEL
Anion gap: 9 (ref 5–15)
BUN: 15 mg/dL (ref 6–20)
CO2: 24 mmol/L (ref 22–32)
Calcium: 7.9 mg/dL — ABNORMAL LOW (ref 8.9–10.3)
Chloride: 103 mmol/L (ref 98–111)
Creatinine, Ser: 0.5 mg/dL (ref 0.44–1.00)
GFR calc Af Amer: >60 mL/min (ref >60)
GFR calc non Af Amer: >60 mL/min (ref >60)
Glucose, Bld: 96 mg/dL (ref 70–99)
Potassium: 4 mmol/L (ref 3.5–5.1)
Sodium: 136 mmol/L (ref 135–145)

## 2019-09-20 MED ORDER — DOCUSATE SODIUM 100 MG PO CAPS: 100.0000 mg | ORAL_CAPSULE | Freq: Two times a day (BID) | ORAL | 0 refills | Status: DC

## 2019-09-20 MED ORDER — FERROUS SULFATE 325 (65 FE) MG PO TABS: 325.0000 mg | ORAL_TABLET | Freq: Three times a day (TID) | ORAL | 0 refills | Status: DC

## 2019-09-20 MED ORDER — POLYETHYLENE GLYCOL 3350 17 G PO PACK: 17.0000 g | PACK | Freq: Two times a day (BID) | ORAL | 0 refills | Status: DC

## 2019-09-20 MED ORDER — ACETAMINOPHEN 500 MG PO TABS: 1000.0000 mg | ORAL_TABLET | Freq: Three times a day (TID) | ORAL | 0 refills | Status: DC

## 2019-09-20 MED ORDER — OXYCODONE HCL 5 MG PO TABS
5.0000 mg | ORAL_TABLET | ORAL | Status: DC | PRN
  Administered 2019-09-20 (×2): 10 mg via ORAL
  Filled 2019-09-20 (×2): qty 2

## 2019-09-20 MED ORDER — OXYCODONE HCL 5 MG PO TABS: 5.0000 mg | ORAL_TABLET | ORAL | 0 refills | Status: DC | PRN

## 2019-09-20 MED ORDER — METHOCARBAMOL 500 MG PO TABS: 500.0000 mg | ORAL_TABLET | Freq: Four times a day (QID) | ORAL | 0 refills | Status: DC | PRN

## 2019-09-20 MED ORDER — ASPIRIN 81 MG PO CHEW: 81.0000 mg | CHEWABLE_TABLET | Freq: Two times a day (BID) | ORAL | 0 refills | Status: AC

## 2019-09-20 MED ORDER — ACETAMINOPHEN 500 MG PO TABS
1000.0000 mg | ORAL_TABLET | Freq: Three times a day (TID) | ORAL | Status: DC
  Administered 2019-09-20: 1000 mg via ORAL
  Filled 2019-09-20: qty 2

## 2019-09-20 NOTE — TOC Initial Note (Signed)
Transition of Care [facility]) - Initial/Assessment Note    Patient Details  Name: Latasha Snyder MRN: [id number] Date of Birth: Sep 02, 1976  [facility] CM/SW Contact:    Trish Mage, LCSW Phone Number: 09/20/2019, 8:36 AM  Clinical Narrative:  Patient to discharge today.  She states she is already working with [facility] due to recent surgery on other knee, and will continue to work with them.  Santiago Glad at [facility] confirms this, and states they will be out to see patient within 48 hours of discharge.  TOC sign off.                 Expected Discharge Plan: [place] Barriers to Discharge: No Barriers Identified   Patient Goals and CMS Choice        Expected Discharge Plan and Services Expected Discharge Plan: [place]         Expected Discharge Date: 09/20/19                                    Prior Living Arrangements/Services                       Activities of Daily Living Home Assistive Devices/Equipment: Gilford Rile (specify type), Crutches, Bedside commode/3-in-1 ADL Screening (condition at time of admission) Patient's cognitive ability adequate to safely complete daily activities?: Yes Is the patient deaf or have difficulty hearing?: No Does the patient have difficulty seeing, even when wearing glasses/contacts?: No Does the patient have difficulty concentrating, remembering, or making decisions?: No Patient able to express need for assistance with ADLs?: Yes Does the patient have difficulty dressing or bathing?: No Independently performs ADLs?: Yes (appropriate for developmental age) Does the patient have difficulty walking or climbing stairs?: No Weakness of Legs: Left Weakness of Arms/Hands: None  Permission Sought/Granted                  Emotional Assessment              Admission diagnosis:  S/P revision of total knee, left [Z96.652] Patient Active Problem List   Diagnosis Date  Noted  . 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 Morbid obesity ([place]) 08/02/2019  . 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 Anemia 12/28/2018  . OSA (obstructive sleep apnea) 09/11/2017  . Hypertension, essential 09/11/2017   PCP:  Sandi Mariscal, MD Pharmacy:   [facility] [place], [place] - [place] [facility] [place] [address] [place] [address] Phone: [phone] Fax: [phone]     Social Determinants of Health (SDOH) Interventions    Readmission Risk Interventions No flowsheet data found.

## 2019-09-20 NOTE — Progress Notes (Signed)
Physical Therapy Treatment Patient Details Name: Latasha Snyder MRN: [id number] DOB: [birth year] Today's Date: 09/20/2019    History of Present Illness Pt s/p L TKR revision and with hx of bil TKR 2011 and revision of R TKR 10/20    PT Comments    Pt in better spirits and stating pain is better controlled and she feels she would like to go home today.  Pt up OOB and ambulated to hall to navigate stairs with min assist and min VC.  Pt eager for dc home.  Follow Up Recommendations  Follow surgeon's recommendation for DC plan and follow-up therapies     Equipment Recommendations  None recommended by PT    Recommendations for Other Services       Precautions / Restrictions Precautions Precautions: Knee Restrictions Weight Bearing Restrictions: Yes LLE Weight Bearing: Partial weight bearing LLE Partial Weight Bearing Percentage or Pounds: 50% Other Position/Activity Restrictions: WB order changed to PWB at 723 this am    Mobility  Bed Mobility Overal bed mobility: Needs Assistance Bed Mobility: Supine to Sit     Supine to sit: Min guard Sit to supine: Min assist   General bed mobility comments: pt self assisting L LE with belt  Transfers Overall transfer level: Needs assistance Equipment used: Rolling walker (2 wheeled) Transfers: Sit to/from Stand Sit to Stand: Supervision         General transfer comment: pt self-cues for LE management and use of UEs to self assist  Ambulation/Gait Ambulation/Gait assistance: Min guard;Supervision Gait Distance (Feet): 52 Feet Assistive device: Rolling walker (2 wheeled) Gait Pattern/deviations: Step-to pattern;Decreased step length - right;Decreased step length - left;Shuffle;Trunk flexed;Antalgic Gait velocity: decr   General Gait Details: increased time with cues for sequence, posture, PWB and position from RW    Stairs Stairs: Yes Stairs assistance: Min assist Stair Management: One rail Right;Step to  pattern;Forwards;With crutches Number of Stairs: 4 General stair comments: 2 steps twice with cues for sequence and foot/crutch placement   Wheelchair Mobility    Modified Rankin (Stroke Patients Only)       Balance Overall balance assessment: Mild deficits observed, not formally tested                                          Cognition Arousal/Alertness: Awake/alert Behavior During Therapy: WFL for tasks assessed/performed Overall Cognitive Status: Within Functional Limits for tasks assessed                                        Exercises      General Comments General comments (skin integrity, edema, etc.): `      Pertinent Vitals/Pain Pain Assessment: 0-10 Pain Score: 6  Pain Location: R knee Pain Descriptors / Indicators: Aching;Sore Pain Intervention(s): Limited activity within patient's tolerance;Monitored during session;Premedicated before session;Ice applied    Home Living                      Prior Function            PT Goals (current goals can now be found in the care plan section) Acute Rehab PT Goals Patient Stated Goal: Regain IND PT Goal Formulation: With patient Time For Goal Achievement: 08/09/19 Potential to Achieve Goals: Good Progress towards PT goals: Progressing toward  goals    Frequency    7X/week      PT Plan Current plan remains appropriate    Co-evaluation              AM-PAC PT "6 Clicks" Mobility   Outcome Measure  Help needed turning from your back to your side while in a flat bed without using bedrails?: A Little Help needed moving from lying on your back to sitting on the side of a flat bed without using bedrails?: A Little Help needed moving to and from a bed to a chair (including a wheelchair)?: A Little Help needed standing up from a chair using your arms (e.g., wheelchair or bedside chair)?: A Little Help needed to walk in hospital room?: A Little Help needed  climbing 3-5 steps with a railing? : A Little 6 Click Score: 18    End of Session Equipment Utilized During Treatment: Gait belt Activity Tolerance: Patient tolerated treatment well Patient left: in chair;with call bell/phone within reach Nurse Communication: Mobility status PT Visit Diagnosis: Difficulty in walking, not elsewhere classified (R26.2)     Time: 6553-7482 PT Time Calculation (min) (ACUTE ONLY): 15 min  Charges:  $Gait Training: 8-22 mins                     Mauro Kaufmann PT Acute Rehabilitation Services Pager [phone] Office [phone]    Josiah Nieto 09/20/2019, 5:35 PM

## 2019-09-20 NOTE — Progress Notes (Signed)
Physical Therapy Treatment Patient Details Name: Latasha Snyder MRN: [id number] DOB: 05-19-1976 Today's Date: 09/20/2019    History of Present Illness Pt s/p L TKR revision and with hx of bil TKR 2011 and revision of R TKR 10/20    PT Comments    Pt very cooperative and progressing well with mobility but with increased time required for completion of all mobility tasks.   Follow Up Recommendations  Follow surgeon's recommendation for DC plan and follow-up therapies     Equipment Recommendations  Rolling walker with 5" wheels    Recommendations for Other Services       Precautions / Restrictions Precautions Precautions: Knee Restrictions Weight Bearing Restrictions: Yes LLE Weight Bearing: Partial weight bearing LLE Partial Weight Bearing Percentage or Pounds: 50% Other Position/Activity Restrictions: WB order changed to PWB at 723 this am    Mobility  Bed Mobility Overal bed mobility: Needs Assistance Bed Mobility: Supine to Sit     Supine to sit: Min assist     General bed mobility comments: cues for sequence and use of R LE to self assist; Physical assist to manage L LE  Transfers Overall transfer level: Needs assistance Equipment used: Rolling walker (2 wheeled) Transfers: Sit to/from Stand Sit to Stand: Min guard         General transfer comment: cues for LE management and use of UEs to self assist  Ambulation/Gait Ambulation/Gait assistance: Min assist;Min guard Gait Distance (Feet): 68 Feet Assistive device: Rolling walker (2 wheeled) Gait Pattern/deviations: Step-to pattern;Decreased step length - right;Decreased step length - left;Shuffle;Trunk flexed;Antalgic Gait velocity: decr   General Gait Details: increased time with cues for sequence, posture, PWB and position from RW    Stairs             Wheelchair Mobility    Modified Rankin (Stroke Patients Only)       Balance Overall balance assessment: Mild deficits observed, not  formally tested                                          Cognition Arousal/Alertness: Awake/alert Behavior During Therapy: WFL for tasks assessed/performed Overall Cognitive Status: Within Functional Limits for tasks assessed                                        Exercises Total Joint Exercises Ankle Circles/Pumps: AROM;Both;20 reps;Supine Quad Sets: AROM;Both;10 reps;Supine Heel Slides: AAROM;Supine;10 reps;Left Straight Leg Raises: AAROM;Supine;10 reps;Left Goniometric ROM: AAROM L knee -5 - 35 - pain ltd with muscle guarding    General Comments        Pertinent Vitals/Pain Pain Assessment: 0-10 Pain Score: 8  Pain Location: R knee Pain Descriptors / Indicators: Aching;Sore Pain Intervention(s): Limited activity within patient's tolerance;Monitored during session;Premedicated before session;Ice applied    Home Living                      Prior Function            PT Goals (current goals can now be found in the care plan section) Acute Rehab PT Goals Patient Stated Goal: Regain IND PT Goal Formulation: With patient Time For Goal Achievement: 08/09/19 Potential to Achieve Goals: Good Progress towards PT goals: Progressing toward goals    Frequency  7X/week      PT Plan Current plan remains appropriate    Co-evaluation              AM-PAC PT "6 Clicks" Mobility   Outcome Measure  Help needed turning from your back to your side while in a flat bed without using bedrails?: A Little Help needed moving from lying on your back to sitting on the side of a flat bed without using bedrails?: A Little Help needed moving to and from a bed to a chair (including a wheelchair)?: A Little Help needed standing up from a chair using your arms (e.g., wheelchair or bedside chair)?: A Little Help needed to walk in hospital room?: A Little Help needed climbing 3-5 steps with a railing? : A Lot 6 Click Score: 17    End of  Session Equipment Utilized During Treatment: Gait belt Activity Tolerance: Patient limited by fatigue Patient left: in chair;with call bell/phone within reach Nurse Communication: Mobility status PT Visit Diagnosis: Difficulty in walking, not elsewhere classified (R26.2)     Time: 8527-7824 PT Time Calculation (min) (ACUTE ONLY): 40 min  Charges:  $Gait Training: 23-37 mins $Therapeutic Exercise: 8-22 mins                     Mauro Kaufmann PT Acute Rehabilitation Services Pager [phone] Office [phone]    Mansoor Hillyard 09/20/2019, 9:53 AM

## 2019-09-20 NOTE — Progress Notes (Signed)
Physical Therapy Treatment Patient Details Name: Latasha Snyder MRN: [id number] DOB: 04-13-1976 Today's Date: 09/20/2019    History of Present Illness Pt s/p L TKR revision and with hx of bil TKR 2011 and revision of R TKR 10/20    PT Comments    Pt continues cooperative and progressing with mobility but continues to rate pain as 8/10.   Follow Up Recommendations  Follow surgeon's recommendation for DC plan and follow-up therapies     Equipment Recommendations  None recommended by PT    Recommendations for Other Services       Precautions / Restrictions Precautions Precautions: Knee Restrictions Weight Bearing Restrictions: Yes LLE Weight Bearing: Partial weight bearing LLE Partial Weight Bearing Percentage or Pounds: 50% Other Position/Activity Restrictions: WB order changed to PWB at 723 this am    Mobility  Bed Mobility Overal bed mobility: Needs Assistance Bed Mobility: Supine to Sit;Sit to Supine     Supine to sit: Min assist Sit to supine: Min assist   General bed mobility comments: cues for sequence and use of R LE to self assist; Physical assist to manage L LE  Transfers Overall transfer level: Needs assistance Equipment used: Rolling walker (2 wheeled) Transfers: Sit to/from Stand Sit to Stand: Min guard         General transfer comment: cues for LE management and use of UEs to self assist  Ambulation/Gait Ambulation/Gait assistance: Min assist;Min guard Gait Distance (Feet): 68 Feet Assistive device: Rolling walker (2 wheeled) Gait Pattern/deviations: Step-to pattern;Decreased step length - right;Decreased step length - left;Shuffle;Trunk flexed;Antalgic Gait velocity: decr   General Gait Details: increased time with cues for sequence, posture, PWB and position from RW    Stairs             Wheelchair Mobility    Modified Rankin (Stroke Patients Only)       Balance Overall balance assessment: Mild deficits observed, not formally  tested                                          Cognition Arousal/Alertness: Awake/alert Behavior During Therapy: WFL for tasks assessed/performed Overall Cognitive Status: Within Functional Limits for tasks assessed                                        Exercises      General Comments General comments (skin integrity, edema, etc.): `      Pertinent Vitals/Pain Pain Assessment: 0-10 Pain Score: 8  Pain Location: R knee Pain Descriptors / Indicators: Aching;Sore Pain Intervention(s): Limited activity within patient's tolerance;Monitored during session;Premedicated before session;Ice applied    Home Living                      Prior Function            PT Goals (current goals can now be found in the care plan section) Acute Rehab PT Goals Patient Stated Goal: Regain IND PT Goal Formulation: With patient Time For Goal Achievement: 08/09/19 Potential to Achieve Goals: Good Progress towards PT goals: Progressing toward goals    Frequency    7X/week      PT Plan Current plan remains appropriate    Co-evaluation  AM-PAC PT "6 Clicks" Mobility   Outcome Measure  Help needed turning from your back to your side while in a flat bed without using bedrails?: A Little Help needed moving from lying on your back to sitting on the side of a flat bed without using bedrails?: A Little Help needed moving to and from a bed to a chair (including a wheelchair)?: A Little Help needed standing up from a chair using your arms (e.g., wheelchair or bedside chair)?: A Little Help needed to walk in hospital room?: A Little Help needed climbing 3-5 steps with a railing? : A Lot 6 Click Score: 17    End of Session Equipment Utilized During Treatment: Gait belt Activity Tolerance: Patient limited by fatigue Patient left: in bed;with call bell/phone within reach Nurse Communication: Mobility status PT Visit Diagnosis:  Difficulty in walking, not elsewhere classified (R26.2)     Time: 7001-7494 PT Time Calculation (min) (ACUTE ONLY): 24 min  Charges:  $Gait Training: 23-37 mins                     Mauro Kaufmann PT Acute Rehabilitation Services Pager [phone] Office [phone]    Raynetta Osterloh 09/20/2019, 2:06 PM

## 2019-09-20 NOTE — Progress Notes (Signed)
     Subjective: 1 Day Post-Op Procedure(s) (LRB): TOTAL KNEE REVISION (Left)   Patient reports pain as moderate, not fully controlled on hydrocodone.  Discussed changing oxycodone she has been on hydrocodone at home since her last surgery.  No reported events throughout the night.  Discussion was had with the patient and Dr. Alvan Dame with regards to partial weightbearing status on the left leg.  The procedure, findings and expectations moving forward were discussed.  Patient will be ready to be discharged home, if she does well with therapy.  Patient does go home she will follow up in 2 weeks.  Patient is to call with any questions or concerns.     Objective:   VITALS:   Vitals:   09/20/19 0123 09/20/19 0510  BP: 103/62 (!) 113/57  Pulse: 75 66  Resp: 16 14  Temp: 98.4 F (36.9 C) 98.5 F (36.9 C)  SpO2: 100% 100%    Dorsiflexion/Plantar flexion intact Incision: dressing C/D/I No cellulitis present Compartment soft  LABS Recent Labs    09/20/19 0449  HGB 8.3*  HCT 27.0*  WBC 8.0  PLT 206    Recent Labs    09/20/19 0449  NA 136  K 4.0  BUN 15  CREATININE 0.50  GLUCOSE 96     Assessment/Plan: 1 Day Post-Op Procedure(s) (LRB): TOTAL KNEE REVISION (Left) Change to oxycodone Foley cath d/c'ed Advance diet Up with therapy PWB <50% left leg D/C IV fluids Discharge home with home health Follow up in 2 weeks at [facility] Follow up with OLIN,Carrieanne Kleen D in 2 weeks.  Contact information:  [facility] [address], [address] [place] [address] [phone]    Morbid Obesity (BMI >40)  Estimated body mass index is 43.89 kg/m as calculated from the following:   Height as of this encounter: 5\' 1"  (1.549 m).   Weight as of this encounter: 105.4 kg. Patient also counseled that weight may inhibit the healing process Patient counseled that losing weight will help with future health issues      West Pugh. Bralyn Espino   PAC  09/20/2019, 7:59 AM

## 2019-09-21 ENCOUNTER — Telehealth: Payer: Self-pay | Admitting: Oncology

## 2019-09-21 NOTE — Telephone Encounter (Signed)
R/s appt per 12/11 sch message - pt aware of appt date and time   

## 2019-09-24 NOTE — Discharge Summary (Signed)
Physician Discharge Summary  Patient ID: Latasha Snyder MRN: [id number] DOB/AGE: 1976/01/27 43 y.o.  Admit date: 09/19/2019 Discharge date: 09/20/2019   Procedures:  Procedure(s) (LRB): TOTAL KNEE REVISION (Left)  Attending Physician:  Dr. Durene RomansMatthew Olin   Admission Diagnoses:   Failed left TKA  Discharge Diagnoses:  Active Problems:   S/P left TKA revision  Past Medical History:  Diagnosis Date  . Anemia   . Arthritis   . Hypertension   . Morbid obesity (HCC)   . Sleep apnea    mild no cpap and weight loss    HPI:    Latasha Snyder, 43 y.o. female, has a history of pain and functional disability in the left knee(s) due to failed previous arthroplasty and patient has failed non-surgical conservative treatments for greater than 12 weeks to include NSAID's and/or analgesics, supervised PT with diminished ADL's post treatment, use of assistive devices and activity modification. The indications for the revision of the total knee arthroplasty are loosening of one or more components and progressive or substantial perporsthetic bone loss. Onset of symptoms was gradual starting 9 years ago with gradually worsening course since that time.  Prior procedures on the left knee(s) include arthroplasty. Patient currently rates pain in the left knee(s) at 10 out of 10 with activity. There is night pain, worsening of pain with activity and weight bearing, pain that interferes with activities of daily living, pain with passive range of motion and joint swelling.  Patient has evidence of prosthetic loosening by imaging studies. This condition presents safety issues increasing the risk of falls.  There is no current active infection.  Risks, benefits and expectations were discussed with the patient.  Risks including but not limited to the risk of anesthesia, blood clots, nerve damage, blood vessel damage, failure of the prosthesis, infection and up to and including death.  Patient understand the risks,  benefits and expectations and wishes to proceed with surgery.  PCP: Salli RealSun, Yun, MD   Discharged Condition: good  Hospital Course:  Patient underwent the above stated procedure on 09/19/2019. Patient tolerated the procedure well and brought to the recovery room in good condition and subsequently to the floor.  POD #1 BP: 113/57 ; Pulse: 66 ; Temp: 98.5 F (36.9 C) ; Resp: 14 Patient reports pain as moderate, not fully controlled on hydrocodone.  Discussed changing oxycodone she has been on hydrocodone at home since her last surgery.  No reported events throughout the night.  Discussion was had with the patient and Dr. Charlann Boxerlin with regards to partial weightbearing status on the left leg.  The procedure, findings and expectations moving forward were discussed.  Patient will be ready to be discharged home. Dorsiflexion/plantar flexion intact, incision: dressing C/D/I, no cellulitis present and compartment soft.   LABS  Basename    HGB     8.3  HCT     27.0    Discharge Exam: General appearance: alert, cooperative and no distress Extremities: Homans sign is negative, no sign of DVT, no edema, redness or tenderness in the calves or thighs and no ulcers, gangrene or trophic changes  Disposition: Home with follow up in 2 weeks   Follow-up Information    Durene Romanslin, Dior Dominik, MD. Schedule an appointment as soon as possible for a visit in 2 weeks.   Specialty: Orthopedic Surgery Contact information: [address] [place] [address] [place] [address] [phone]           Discharge Instructions    Call MD /  Call 911   Complete by: As directed    If you experience chest pain or shortness of breath, CALL 911 and be transported to the hospital emergency room.  If you develope a fever above 101 F, pus (white drainage) or increased drainage or redness at the wound, or calf pain, call your surgeon's office.   Change dressing   Complete by: As directed    Maintain surgical dressing until follow up  in the clinic. If the edges start to pull up, may reinforce with tape. If the dressing is no longer working, may remove and cover with gauze and tape, but must keep the area dry and clean.  Call with any questions or concerns.   Constipation Prevention   Complete by: As directed    Drink plenty of fluids.  Prune juice may be helpful.  You may use a stool softener, such as Colace (over the counter) 100 mg twice a day.  Use MiraLax (over the counter) for constipation as needed.   Diet - low sodium heart healthy   Complete by: As directed    Discharge instructions   Complete by: As directed    Maintain surgical dressing until follow up in the clinic. If the edges start to pull up, may reinforce with tape. If the dressing is no longer working, may remove and cover with gauze and tape, but must keep the area dry and clean.  Follow up in 2 weeks at [facility]. Call with any questions or concerns.   Partial weight bearing   Complete by: As directed    % Body Weight: <50%   Laterality: left   Extremity: Lower   TED hose   Complete by: As directed    Use stockings (TED hose) for 2 weeks on both leg(s).  You may remove them at night for sleeping.      Allergies as of 09/20/2019   No Known Allergies     Medication List    STOP taking these medications   HYDROcodone-acetaminophen 7.5-325 MG tablet Commonly known as: NORCO   meloxicam 15 MG tablet Commonly known as: MOBIC   polyethylene glycol powder 17 GM/SCOOP powder Commonly known as: GLYCOLAX/MIRALAX Replaced by: polyethylene glycol 17 g packet     TAKE these medications   acetaminophen 500 MG tablet Commonly known as: TYLENOL Take 2 tablets (1,000 mg total) by mouth every 8 (eight) hours.   aspirin 81 MG chewable tablet Commonly known as: Aspirin Childrens Chew 1 tablet (81 mg total) by mouth 2 (two) times daily. Take for 4 weeks, then resume regular dose.   cholecalciferol 25 MCG (1000 UT) tablet Commonly known  as: VITAMIN D3 Take 1,000 Units by mouth 4 (four) times a week.   docusate sodium 100 MG capsule Commonly known as: Colace Take 1 capsule (100 mg total) by mouth 2 (two) times daily.   ferrous sulfate 325 (65 FE) MG tablet Commonly known as: FerrouSul Take 1 tablet (325 mg total) by mouth 3 (three) times daily with meals for 14 days.   methocarbamol 500 MG tablet Commonly known as: Robaxin Take 1 tablet (500 mg total) by mouth every 6 (six) hours as needed for muscle spasms.   oxyCODONE 5 MG immediate release tablet Commonly known as: Oxy IR/ROXICODONE Take 1-2 tablets (5-10 mg total) by mouth every 4 (four) hours as needed for moderate pain or severe pain. What changed:   medication strength  how much to take   polyethylene glycol 17 g packet Commonly known  as: MIRALAX / GLYCOLAX Take 17 g by mouth 2 (two) times daily. Replaces: polyethylene glycol powder 17 GM/SCOOP powder            Discharge Care Instructions  (From admission, onward)         Start     Ordered   09/20/19 0000  Change dressing    Comments: Maintain surgical dressing until follow up in the clinic. If the edges start to pull up, may reinforce with tape. If the dressing is no longer working, may remove and cover with gauze and tape, but must keep the area dry and clean.  Call with any questions or concerns.   09/20/19 0806   09/20/19 0000  Partial weight bearing    Question Answer Comment  % Body Weight <50%   Laterality left   Extremity Lower      09/20/19 4008           Signed: Anastasio Auerbach. Lilyana Lippman   PA-C  09/24/2019, 8:22 AM

## 2019-10-18 ENCOUNTER — Inpatient Hospital Stay: Payer: Medicare Other | Attending: Oncology

## 2019-10-18 ENCOUNTER — Other Ambulatory Visit: Payer: Self-pay

## 2019-10-18 VITALS — BP 132/85 | HR 78 | Temp 98.1°F | Resp 16

## 2019-10-18 DIAGNOSIS — N920 Excessive and frequent menstruation with regular cycle: Secondary | ICD-10-CM | POA: Diagnosis not present

## 2019-10-18 DIAGNOSIS — D500 Iron deficiency anemia secondary to blood loss (chronic): Secondary | ICD-10-CM | POA: Insufficient documentation

## 2019-10-18 DIAGNOSIS — D649 Anemia, unspecified: Secondary | ICD-10-CM

## 2019-10-18 MED ORDER — SODIUM CHLORIDE 0.9 % IV SOLN
510.0000 mg | Freq: Once | INTRAVENOUS | Status: AC
  Administered 2019-10-18: 14:00:00 510 mg via INTRAVENOUS
  Filled 2019-10-18: qty 510

## 2019-10-18 MED ORDER — SODIUM CHLORIDE 0.9 % IV SOLN
Freq: Once | INTRAVENOUS | Status: AC
  Filled 2019-10-18: qty 250

## 2019-10-18 NOTE — Patient Instructions (Signed)

## 2019-12-27 ENCOUNTER — Inpatient Hospital Stay: Payer: Medicare Other | Admitting: Oncology

## 2019-12-27 ENCOUNTER — Inpatient Hospital Stay: Payer: Medicare Other

## 2020-01-02 ENCOUNTER — Other Ambulatory Visit: Payer: Self-pay

## 2020-01-02 DIAGNOSIS — D649 Anemia, unspecified: Secondary | ICD-10-CM

## 2020-01-03 ENCOUNTER — Inpatient Hospital Stay (HOSPITAL_BASED_OUTPATIENT_CLINIC_OR_DEPARTMENT_OTHER): Payer: Medicare Other | Admitting: Oncology

## 2020-01-03 ENCOUNTER — Inpatient Hospital Stay: Payer: Medicare Other | Attending: Oncology

## 2020-01-03 ENCOUNTER — Other Ambulatory Visit: Payer: Self-pay

## 2020-01-03 ENCOUNTER — Telehealth: Payer: Self-pay | Admitting: Oncology

## 2020-01-03 VITALS — BP 113/72 | HR 57 | Temp 98.3°F | Resp 18 | Ht 61.0 in | Wt 230.6 lb

## 2020-01-03 DIAGNOSIS — D500 Iron deficiency anemia secondary to blood loss (chronic): Secondary | ICD-10-CM | POA: Insufficient documentation

## 2020-01-03 DIAGNOSIS — N920 Excessive and frequent menstruation with regular cycle: Secondary | ICD-10-CM | POA: Insufficient documentation

## 2020-01-03 DIAGNOSIS — D649 Anemia, unspecified: Secondary | ICD-10-CM

## 2020-01-03 LAB — CBC WITH DIFFERENTIAL (CANCER CENTER ONLY)
Abs Immature Granulocytes: 0.01 10*3/uL (ref 0.00–0.07)
Basophils Absolute: 0 10*3/uL (ref 0.0–0.1)
Basophils Relative: 1 %
Eosinophils Absolute: 0.1 10*3/uL (ref 0.0–0.5)
Eosinophils Relative: 2 %
HCT: 38.2 % (ref 36.0–46.0)
Hemoglobin: 12 g/dL (ref 12.0–15.0)
Immature Granulocytes: 0 %
Lymphocytes Relative: 56 %
Lymphs Abs: 2.3 10*3/uL (ref 0.7–4.0)
MCH: 27.1 pg (ref 26.0–34.0)
MCHC: 31.4 g/dL (ref 30.0–36.0)
MCV: 86.2 fL (ref 80.0–100.0)
Monocytes Absolute: 0.3 10*3/uL (ref 0.1–1.0)
Monocytes Relative: 8 %
Neutro Abs: 1.4 10*3/uL — ABNORMAL LOW (ref 1.7–7.7)
Neutrophils Relative %: 33 %
Platelet Count: 238 10*3/uL (ref 150–400)
RBC: 4.43 MIL/uL (ref 3.87–5.11)
RDW: 15.9 % — ABNORMAL HIGH (ref 11.5–15.5)
WBC Count: 4.1 10*3/uL (ref 4.0–10.5)
nRBC: 0 % (ref 0.0–0.2)

## 2020-01-03 LAB — IRON AND TIBC
Iron: 60 ug/dL (ref 41–142)
Saturation Ratios: 23 % (ref 21–57)
TIBC: 261 ug/dL (ref 236–444)
UIBC: 201 ug/dL (ref 120–384)

## 2020-01-03 LAB — FERRITIN: Ferritin: 235 ng/mL (ref 11–307)

## 2020-01-03 NOTE — Progress Notes (Signed)
Hematology and Oncology Follow Up Visit  Latasha Snyder [id number] 13-Apr-1976 44 y.o. 01/03/2020 9:36 AM Salli Real, MDSun, Charyl Dancer, MD   Principle Diagnosis: 44 year old woman with iron deficiency anemia related to chronic menstrual blood losses since March 2020.  Anemia diagnosed in March 2020.     Prior Therapy: Feraheme infusion as needed with the last treatment given in November 2020.  Current therapy: Intermittent intravenous iron.  She is intolerant to oral iron therapy.  Interim History: Latasha Snyder is here for a follow-up.  Since the last visit, she received intravenous iron in November 2020 and tolerated it well.  She denies any complications since that time.  She denies any nausea, vomiting or abdominal pain.  She denies any excessive fatigue or tiredness.  She denies any infusion related issues.        Medications: Reviewed without changes. Current Outpatient Medications  Medication Sig Dispense Refill  . acetaminophen (TYLENOL) 500 MG tablet Take 2 tablets (1,000 mg total) by mouth every 8 (eight) hours. 30 tablet 0  . cholecalciferol (VITAMIN D3) 25 MCG (1000 UT) tablet Take 1,000 Units by mouth 4 (four) times a week.    . docusate sodium (COLACE) 100 MG capsule Take 1 capsule (100 mg total) by mouth 2 (two) times daily. 28 capsule 0  . ferrous sulfate (FERROUSUL) 325 (65 FE) MG tablet Take 1 tablet (325 mg total) by mouth 3 (three) times daily with meals for 14 days. 42 tablet 0  . methocarbamol (ROBAXIN) 500 MG tablet Take 1 tablet (500 mg total) by mouth every 6 (six) hours as needed for muscle spasms. 40 tablet 0  . oxyCODONE (OXY IR/ROXICODONE) 5 MG immediate release tablet Take 1-2 tablets (5-10 mg total) by mouth every 4 (four) hours as needed for moderate pain or severe pain. 60 tablet 0  . polyethylene glycol (MIRALAX / GLYCOLAX) 17 g packet Take 17 g by mouth 2 (two) times daily. 28 packet 0   No current facility-administered medications for this visit.      Allergies: No Known Allergies      Physical Exam:  Blood pressure 113/72, pulse (!) 57, temperature 98.3 F (36.8 C), temperature source Temporal, resp. rate 18, height 5\' 1"  (1.549 m), weight 230 lb 9.6 oz (104.6 kg), SpO2 100 %.   ECOG: 0    General appearance: Comfortable appearing without any discomfort Head: Normocephalic without any trauma Oropharynx: Mucous membranes are moist and pink without any thrush or ulcers. Eyes: Pupils are equal and round reactive to light. Lymph nodes: No cervical, supraclavicular, inguinal or axillary lymphadenopathy.   Heart:regular rate and rhythm.  S1 and S2 without leg edema. Lung: Clear without any rhonchi or wheezes.  No dullness to percussion. Abdomin: Soft, nontender, nondistended with good bowel sounds.  No hepatosplenomegaly. Musculoskeletal: No joint deformity or effusion.  Full range of motion noted. Neurological: No deficits noted on motor, sensory and deep tendon reflex exam. Skin: No petechial rash or dryness.  Appeared moist.         Lab Results: Lab Results  Component Value Date   WBC 4.1 01/03/2020   HGB 12.0 01/03/2020   HCT 38.2 01/03/2020   MCV 86.2 01/03/2020   PLT 238 01/03/2020     Chemistry      Component Value Date/Time   NA 136 09/20/2019 0449   K 4.0 09/20/2019 0449   CL 103 09/20/2019 0449   CO2 24 09/20/2019 0449   BUN 15 09/20/2019 0449   CREATININE 0.50 09/20/2019  0449      Component Value Date/Time   CALCIUM 7.9 (L) 09/20/2019 0449        Impression and Plan:  44 year old woman with the:  1.    Iron deficiency anemia related to chronic menstrual blood losses documented in 2020.  Marland Kitchen   She is status post intravenous iron infusion on few occasions.  Laboratory data obtained on January 03, 2020 showed a normal hemoglobin although her iron studies are currently pending.  I recommended continued active surveillance and repeat intravenous iron as needed.  I recommended oral iron  maintenance although she has been intolerant to it.  The etiology of iron deficiency remains related to menstrual losses although it appears to be improving at this time.    2.  Follow-up: In 4 months for a follow-up visit.   20  minutes were spent on this visit.  The time was dedicated to reviewing laboratory data, disease status update and answering questions regarding future plan of care.      Zola Button, MD 3/26/20219:36 AM

## 2020-01-03 NOTE — Telephone Encounter (Signed)
Scheduled appt per 3/26 los.  Sent a message to HIM pool to get a calendar mailed out. 

## 2020-05-05 ENCOUNTER — Inpatient Hospital Stay: Payer: Medicare Other

## 2020-05-05 ENCOUNTER — Inpatient Hospital Stay: Payer: Medicare Other | Attending: Oncology | Admitting: Oncology

## 2020-10-13 ENCOUNTER — Ambulatory Visit (INDEPENDENT_AMBULATORY_CARE_PROVIDER_SITE_OTHER): Payer: Medicare Other | Admitting: Licensed Clinical Social Worker

## 2020-10-13 DIAGNOSIS — F411 Generalized anxiety disorder: Secondary | ICD-10-CM | POA: Diagnosis not present

## 2020-10-13 DIAGNOSIS — F331 Major depressive disorder, recurrent, moderate: Secondary | ICD-10-CM
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
## 2020-11-02 ENCOUNTER — Ambulatory Visit (INDEPENDENT_AMBULATORY_CARE_PROVIDER_SITE_OTHER): Payer: Medicare Other | Admitting: Licensed Clinical Social Worker

## 2020-11-02 DIAGNOSIS — F331 Major depressive disorder, recurrent, moderate: Secondary | ICD-10-CM

## 2020-11-02 NOTE — Progress Notes (Signed)
Virtual Visit via Video Note  I connected with Latasha Snyder on 11/02/20 at  1:00 PM EST by a video enabled telemedicine application and verified that I am speaking with the correct person using two identifiers.  Location: Patient: home Provider: office   I discussed the limitations of evaluation and management by telemedicine and the availability of in person appointments. The patient expressed understanding and agreed to proceed.  THERAPIST PROGRESS NOTE  Session Time: 1:05 pm-2:00 pm  Type of Therapy: Individual Therapy   Session#1  Purpose of Session/Treatment Goals addressed: "Latasha Snyder will manage mood and anxiety as evidenced by identifying triggers for mood and anxiety, reducing anxious and depressive thoughts, reducing people pleasing, regulating sleep, and improving mood for 5 out of 7 days for 60 days."   Interventions: Therapist utilized CBT and Solution focused brief therapy to address mood and anxiety. Therapist provided support and empathy to patient as she shared her thoughts and feelings during session. Therapist had patient identify pre-session change/progress. Therapist explored patient reducing her people pleasing, increase setting boundaries, and work on acceptance. Therapist discussed with patient challenging anxious thoughts by examining the worst case, best case, and most likely outcome. Therapist worked with patient to identify a positive affirmation she could say to herself daily to replace old negative thought patterns.    Effectiveness: Patient was oriented x5 (person, place, situation, time, and object). Patient was dressed casually and groomed appropriately. Patient was pleasant during session. Patient was alert and engaged. Patient noted that she has decided to start walking again. She understood the benefits of physical exercise including physical health, stress reduction, and disconnecting from technology, etc. Patient is working on setting boundaries and not  people pleasing. She recognizes that others are going to get upset with her because she has gone above and beyond to help others in the past. Patient also is working on accepting that she is going to have difficult days and difficult feelings no matter how faithful she is or how good of a person she is. Patient working on acceptance of these aspects of life. Patient was able to see how considering not only the worst case scenario but also the best case scenario and most likely outcome can help challenge anxious thoughts. Patient is going to start saying a daily affirmation she created stating "I am worthy of the respect that I desire from others."   Patient engaged in session. She responded well to interventions. Patient continues to meet criteria for Major depressive disorder, recurrent episode, moderate with anxious distress and Generalized Anxiety Disorder. Patient will continue in outpatient therapy due to being the least restrictive service to meet her needs. Patient made minimal progress on her goals.    Suicidal/Homicidal: Negativewithout intent/plan   Plan: Return again in 1 weeks.  Diagnosis: Axis I: Generalized Anxiety Disorder and Major depressive disorder, recurrent episode, moderate with anxious distress    Axis II: No diagnosis   I discussed the assessment and treatment plan with the patient. The patient was provided an opportunity to ask questions and all were answered. The patient agreed with the plan and demonstrated an understanding of the instructions.   The patient was advised to call back or seek an in-person evaluation if the symptoms worsen or if the condition fails to improve as anticipated.  I provided 55 minutes of non-face-to-face time during this encounter.   Bynum Bellows, LCSW 11/02/2020

## 2020-11-09 ENCOUNTER — Ambulatory Visit (HOSPITAL_COMMUNITY): Payer: Medicaid Other | Admitting: Licensed Clinical Social Worker

## 2020-11-11 ENCOUNTER — Ambulatory Visit (INDEPENDENT_AMBULATORY_CARE_PROVIDER_SITE_OTHER): Payer: Medicare Other | Admitting: Licensed Clinical Social Worker

## 2020-11-11 DIAGNOSIS — F331 Major depressive disorder, recurrent, moderate: Secondary | ICD-10-CM | POA: Diagnosis not present

## 2020-11-11 NOTE — Progress Notes (Signed)
Virtual Visit via Video Note  I connected with Latasha Snyder on 11/11/20 at  1:00 PM EST by a video enabled telemedicine application and verified that I am speaking with the correct person using two identifiers.  Location: Patient: home Provider: office   I discussed the limitations of evaluation and management by telemedicine and the availability of in person appointments. The patient expressed understanding and agreed to proceed.  THERAPIST PROGRESS NOTE  Session Time: 1:10 pm-1:50 pm  Type of Therapy: Individual Therapy   Session#2  Purpose of Session/Treatment Goals addressed: "[name] will manage mood and anxiety as evidenced by identifying triggers for mood and anxiety, reducing anxious and depressive thoughts, reducing people pleasing, regulating sleep, and improving mood for 5 out of 7 days for 60 days."   Interventions: Therapist utilized CBT and Solution focused brief therapy to address mood and anxiety. Therapist provided support and empathy to patient as she shared her thoughts and feelings during session. Therapist followed up on patient's homework to say affirmations to herself and catch herself early with her mood or anxiety. Therapist praised client for her progress. Therapist recommitted patient to continue using these coping skills to manage anxiety and mood to develop a solid foundation/base line.   Effectiveness: Patient was oriented x5 (person, place, situation, time, and object). Patient was casually dressed, and appropriately groomed during session. Patient was alert and pleasant during session. Patient noted she has had some improvement since the last session. She is less anxious, more intentional, setting boundaries, and created several affirmations to say to herself about what she deserves and the life she wants to live. She is doing well physically, spiritually, in her relationships, and with her mood. Patient understood that continuing to "catch" her mood/being  mindful, being intentional, walking, setting boundaries, and using affirmations helps her.  She is going to continue to practice these coping skills to improve mood and manage anxiety.   Patient engaged in session. She responded well to interventions. Patient continues to meet criteria for Major depressive disorder, recurrent episode, moderate with anxious distress and Generalized Anxiety Disorder. Patient will continue in outpatient therapy due to being the least restrictive service to meet her needs. Patient made moderate progress on her goals.    Suicidal/Homicidal: Negativewithout intent/plan   Plan: Return again in 1 weeks.  Diagnosis: Axis I: Generalized Anxiety Disorder and Major depressive disorder, recurrent episode, moderate with anxious distress    Axis II: No diagnosis   I discussed the assessment and treatment plan with the patient. The patient was provided an opportunity to ask questions and all were answered. The patient agreed with the plan and demonstrated an understanding of the instructions.   The patient was advised to call back or seek an in-person evaluation if the symptoms worsen or if the condition fails to improve as anticipated.  I provided 45 minutes of non-face-to-face time during this encounter.   Bynum Bellows, LCSW 11/11/2020

## 2020-11-16 ENCOUNTER — Ambulatory Visit (HOSPITAL_COMMUNITY): Payer: Medicare Other | Admitting: Licensed Clinical Social Worker

## 2020-11-16 ENCOUNTER — Telehealth (HOSPITAL_COMMUNITY): Payer: Self-pay | Admitting: Licensed Clinical Social Worker

## 2020-11-16 NOTE — Telephone Encounter (Signed)
I sent a text [phone]) to patient at 1pm to connect for video session. I waited in video session for 15 minutes. No response.

## 2020-11-23 ENCOUNTER — Ambulatory Visit (HOSPITAL_COMMUNITY): Payer: Medicaid Other | Admitting: Licensed Clinical Social Worker

## 2021-09-03 ENCOUNTER — Other Ambulatory Visit: Payer: Self-pay

## 2021-09-03 ENCOUNTER — Encounter: Payer: Self-pay | Admitting: Oncology

## 2021-09-03 ENCOUNTER — Ambulatory Visit (HOSPITAL_COMMUNITY)
Admission: EM | Admit: 2021-09-03 | Discharge: 2021-09-03 | Disposition: A | Discharge status: Home / Self Care | Payer: Medicare Other | Attending: Internal Medicine | Admitting: Internal Medicine

## 2021-09-03 ENCOUNTER — Encounter (HOSPITAL_COMMUNITY): Payer: Self-pay | Admitting: Emergency Medicine

## 2021-09-03 DIAGNOSIS — J040 Acute laryngitis: Secondary | ICD-10-CM

## 2021-09-03 MED ORDER — FLUTICASONE PROPIONATE 50 MCG/ACT NA SUSP: 1.0000 | Freq: Every day | NASAL | 0 refills | Status: AC

## 2021-09-03 MED ORDER — BENZONATATE 100 MG PO CAPS: 100.0000 mg | ORAL_CAPSULE | Freq: Three times a day (TID) | ORAL | 0 refills | Status: DC | PRN

## 2021-09-03 NOTE — Discharge Instructions (Addendum)
Use is recommended Saline nasal spray Use medications as prescribed If symptoms worsen please return to urgent care to be reevaluated.

## 2021-09-03 NOTE — ED Triage Notes (Signed)
Pt having sore throat, cough, headache, chills for about a week.

## 2021-09-05 NOTE — ED Provider Notes (Signed)
[facility]    CSN: [id number] Arrival date & time: 09/03/21  1737      History   Chief Complaint Chief Complaint  Patient presents with   Sore Throat   Facial Pain   Cough    HPI Latasha Snyder is a 45 y.o. female comes to the urgent care with 1 week history of sore throat, nonproductive cough, headache and chills.  Patient's symptoms started fairly abruptly and has been persistent.  She has had some improvement in headache, chills and sore throat.  She continues to have nasal congestion, postnasal drip and cough.  No fever.  No vomiting or diarrhea.  No shortness of breath or wheezing.  Patient complains of hoarseness of her voice. 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   Morbid obesity (HCC) 08/02/2019   S/P right TKA revision 08/01/2019   Anemia 12/28/2018   OSA (obstructive sleep apnea) 09/11/2017   Hypertension, essential 09/11/2017    Past Surgical History:  Procedure Laterality Date   CHOLECYSTECTOMY     KNEE SURGERY     LAPAROSCOPIC GASTRIC SLEEVE RESECTION     06-2018   TOTAL KNEE ARTHROPLASTY Bilateral    TOTAL KNEE REVISION Right 08/01/2019   Procedure: TOTAL KNEE REVISION;  Surgeon: Durene Romans, MD;  Location: [facility];  Service: Orthopedics;  Laterality: Right;  90 mins   TOTAL KNEE REVISION Left 09/19/2019   Procedure: TOTAL KNEE REVISION;  Surgeon: Durene Romans, MD;  Location: [facility];  Service: Orthopedics;  Laterality: Left;  90 mins   TUBAL LIGATION      OB History   No obstetric history on file.      Home Medications    Prior to Admission medications   Medication Sig Start Date End Date Taking? Authorizing Provider  benzonatate (TESSALON) 100 MG capsule Take 1 capsule (100 mg total) by mouth 3 (three) times daily as needed  for cough. 09/03/21  Yes Kristine Chahal, Britta Mccreedy, MD  fluticasone (FLONASE) 50 MCG/ACT nasal spray Place 1 spray into both nostrils daily. 09/03/21  Yes Marti Mclane, Britta Mccreedy, MD  acetaminophen (TYLENOL) 500 MG tablet Take 2 tablets (1,000 mg total) by mouth every 8 (eight) hours. 09/20/19   Lanney Gins, PA-C  cholecalciferol (VITAMIN D3) 25 MCG (1000 UT) tablet Take 1,000 Units by mouth 4 (four) times a week.    [provider]  docusate sodium (COLACE) 100 MG capsule Take 1 capsule (100 mg total) by mouth 2 (two) times daily. 09/20/19   Lanney Gins, PA-C  ferrous sulfate (FERROUSUL) 325 (65 FE) MG tablet Take 1 tablet (325 mg total) by mouth 3 (three) times daily with meals for 14 days. 09/20/19 10/04/19  Lanney Gins, PA-C  methocarbamol (ROBAXIN) 500 MG tablet Take 1 tablet (500 mg total) by mouth every 6 (six) hours as needed for muscle spasms. 09/20/19   Lanney Gins, PA-C  oxyCODONE (OXY IR/ROXICODONE) 5 MG immediate release tablet Take 1-2 tablets (5-10 mg total) by mouth every 4 (four) hours as needed for moderate pain or severe pain. 09/20/19   Lanney Gins, PA-C  polyethylene glycol (MIRALAX / GLYCOLAX) 17 g packet Take 17 g by mouth 2 (two) times daily. 09/20/19  Lanney Gins, PA-C    Family History No family history on file.  Social History Social History   Tobacco Use   Smoking status: Former    Years: 10.00    Types: Cigarettes   Smokeless tobacco: Never  Vaping Use   Vaping Use: Never used  Substance Use Topics   Alcohol use: Yes    Comment: socially   Drug use: No     Allergies   Patient has no known allergies.   Review of Systems Review of Systems  Constitutional: Negative.   HENT:  Positive for congestion, postnasal drip and sore throat.   Respiratory:  Positive for cough. Negative for chest tightness, shortness of breath and wheezing.   Genitourinary: Negative.   Neurological: Negative.     Physical Exam Triage Vital Signs ED  Triage Vitals  Enc Vitals Group     BP 09/03/21 1918 131/85     Pulse Rate 09/03/21 1918 68     Resp 09/03/21 1918 18     Temp 09/03/21 1918 98.8 F (37.1 C)     Temp Source 09/03/21 1918 Oral     SpO2 09/03/21 1918 98 %     Weight --      Height --      Head Circumference --      Peak Flow --      Pain Score 09/03/21 1919 9     Pain Loc --      Pain Edu? --      Excl. in GC? --    No data found.  Updated Vital Signs BP 131/85 (BP Location: Left Arm)   Pulse 68   Temp 98.8 F (37.1 C) (Oral)   Resp 18   SpO2 98%   Visual Acuity Right Eye Distance:   Left Eye Distance:   Bilateral Distance:    Right Eye Near:   Left Eye Near:    Bilateral Near:     Physical Exam Vitals and nursing note reviewed.  Constitutional:      General: She is not in acute distress.    Appearance: She is not ill-appearing.  HENT:     Right Ear: Tympanic membrane normal.     Left Ear: Tympanic membrane normal.     Mouth/Throat:     Mouth: Mucous membranes are moist.     Pharynx: No posterior oropharyngeal erythema.     Tonsils: No tonsillar exudate or tonsillar abscesses. 1+ on the right. 1+ on the left.  Cardiovascular:     Rate and Rhythm: Normal rate and regular rhythm.  Pulmonary:     Effort: Pulmonary effort is normal.     Breath sounds: Normal breath sounds.  Abdominal:     General: Bowel sounds are normal.     Palpations: Abdomen is soft.  Musculoskeletal:     Cervical back: Normal range of motion.  Neurological:     Mental Status: She is alert.     UC Treatments / Results  Labs (all labs ordered are listed, but only abnormal results are displayed) Labs Reviewed - No data to display  EKG   Radiology No results found.  Procedures Procedures (including critical care time)  Medications Ordered in UC Medications - No data to display  Initial Impression / Assessment and Plan / UC Course  I have reviewed the triage vital signs and the nursing notes.  Pertinent  labs & imaging results that were available during my care of the patient were reviewed by me and considered  in my medical decision making (see chart for details).     1.  Acute viral laryngitis: Fluticasone nasal spray Tessalon Perles as needed for cough No indication for flu or COVID testing given the duration of symptoms Maintain adequate hydration Your lung exam is not impressive. Return to urgent care if symptoms worsen. Final Clinical Impressions(s) / UC Diagnoses   Final diagnoses:  Acute viral laryngitis     Discharge Instructions      Use is recommended Saline nasal spray Use medications as prescribed If symptoms worsen please return to urgent care to be reevaluated.    ED Prescriptions     Medication Sig Dispense Auth. Provider   fluticasone (FLONASE) 50 MCG/ACT nasal spray Place 1 spray into both nostrils daily. 16 g Lotoya Casella, Britta Mccreedy, MD   benzonatate (TESSALON) 100 MG capsule Take 1 capsule (100 mg total) by mouth 3 (three) times daily as needed for cough. 21 capsule Lisvet Rasheed, Britta Mccreedy, MD      PDMP not reviewed this encounter.   Merrilee Jansky, MD 09/05/21 1447

## 2021-11-14 ENCOUNTER — Ambulatory Visit (INDEPENDENT_AMBULATORY_CARE_PROVIDER_SITE_OTHER): Payer: Medicare Other

## 2021-11-14 ENCOUNTER — Ambulatory Visit (HOSPITAL_COMMUNITY)
Admission: EM | Admit: 2021-11-14 | Discharge: 2021-11-14 | Disposition: A | Discharge status: Home / Self Care | Payer: Medicare Other | Attending: Emergency Medicine | Admitting: Emergency Medicine

## 2021-11-14 ENCOUNTER — Encounter (HOSPITAL_COMMUNITY): Payer: Self-pay

## 2021-11-14 ENCOUNTER — Other Ambulatory Visit: Payer: Self-pay

## 2021-11-14 DIAGNOSIS — L089 Local infection of the skin and subcutaneous tissue, unspecified: Secondary | ICD-10-CM | POA: Diagnosis not present

## 2021-11-14 DIAGNOSIS — S91331A Puncture wound without foreign body, right foot, initial encounter: Secondary | ICD-10-CM | POA: Diagnosis not present

## 2021-11-14 MED ORDER — LEVOFLOXACIN 750 MG PO TABS: 750.0000 mg | ORAL_TABLET | Freq: Every day | ORAL | 0 refills | Status: AC

## 2021-11-14 MED ORDER — TETANUS-DIPHTH-ACELL PERTUSSIS 5-2.5-18.5 LF-MCG/0.5 IM SUSY
PREFILLED_SYRINGE | INTRAMUSCULAR | Status: AC
  Filled 2021-11-14: qty 0.5

## 2021-11-14 MED ORDER — TETANUS-DIPHTH-ACELL PERTUSSIS 5-2.5-18.5 LF-MCG/0.5 IM SUSY
0.5000 mL | PREFILLED_SYRINGE | Freq: Once | INTRAMUSCULAR | Status: AC
  Administered 2021-11-14: 0.5 mL via INTRAMUSCULAR

## 2021-11-14 NOTE — ED Triage Notes (Signed)
Pt presents with unknown object stuck in right foot from stepping on something in her carpet yesterday.

## 2021-11-14 NOTE — Discharge Instructions (Addendum)
Thankfully, there was no foreign body seen in your foot on x-ray.  I believe that the swelling that you feel is due to possible infection and therefore recommend that you begin taking an antibiotic for the next 7 days.  I have prescribed levofloxacin, please take 1 tablet daily for the full 7-day course.  If you have not had improvement of your swelling and pain after 7 days of antibiotics, I recommend that you return for repeat evaluation.

## 2021-11-14 NOTE — ED Provider Notes (Signed)
[facility]    CSN: [id number] Arrival date & time: 11/14/21  1549    HISTORY   Chief Complaint  Patient presents with   Puncture Wound   Foot Injury   HPI Latasha Snyder is a 46 y.o. female. Pt presents with chief complaint of stepping on something in her carpet yesterday.  Patient states initially she did not really notice the pain and when she did finally feel a small pain sensation, states she did not stop to inspect her foot.  Patient states this morning, when she stood up she noticed that her foot was very tender and painful when walking.  Patient states when she went back to look around her home for what may have caused her injury, she found a small stick on the carpet that looked like the top is broken off, states she did not see anything sticking out of her foot.  The history is provided by the patient.  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   Morbid obesity (HCC) 08/02/2019   S/P right TKA revision 08/01/2019   Anemia 12/28/2018   OSA (obstructive sleep apnea) 09/11/2017   Hypertension, essential 09/11/2017   Past Surgical History:  Procedure Laterality Date   CHOLECYSTECTOMY     KNEE SURGERY     LAPAROSCOPIC GASTRIC SLEEVE RESECTION     06-2018   TOTAL KNEE ARTHROPLASTY Bilateral    TOTAL KNEE REVISION Right 08/01/2019   Procedure: TOTAL KNEE REVISION;  Surgeon: Durene Romans, MD;  Location: [facility];  Service: Orthopedics;  Laterality: Right;  90 mins   TOTAL KNEE REVISION Left 09/19/2019   Procedure: TOTAL KNEE REVISION;  Surgeon: Durene Romans, MD;  Location: [facility];  Service: Orthopedics;  Laterality: Left;  90 mins   TUBAL LIGATION     OB History   No obstetric history on file.    Home Medications    Prior to Admission medications   Medication  Sig Start Date End Date Taking? Authorizing Provider  cholecalciferol (VITAMIN D3) 25 MCG (1000 UT) tablet Take 1,000 Units by mouth 4 (four) times a week.    [provider]  fluticasone (FLONASE) 50 MCG/ACT nasal spray Place 1 spray into both nostrils daily. 09/03/21   Lamptey, Britta Mccreedy, MD    Family History Family History  Family history unknown: Yes   Social History Social History   Tobacco Use   Smoking status: Former    Years: 10.00    Types: Cigarettes   Smokeless tobacco: Never  Vaping Use   Vaping Use: Never used  Substance Use Topics   Alcohol use: Yes    Comment: socially   Drug use: No   Allergies   Patient has no known allergies.  Review of Systems Review of Systems Pertinent findings noted in history of present illness.   Physical Exam Triage Vital Signs ED Triage Vitals  Enc Vitals Group     BP 08/06/21 0827 (!) 147/82     Pulse Rate 08/06/21 0827 72     Resp 08/06/21 0827 18     Temp 08/06/21 0827 98.3 F (36.8 C)     Temp Source 08/06/21 0827 Oral     SpO2 08/06/21 0827 98 %  Weight --      Height --      Head Circumference --      Peak Flow --      Pain Score 08/06/21 0826 5     Pain Loc --      Pain Edu? --      Excl. in GC? --   No data found.  Updated Vital Signs BP (!) 140/96 (BP Location: Right Arm)    Pulse 72    Temp 98.2 F (36.8 C) (Oral)    Resp 18    LMP 10/17/2021    SpO2 100%   Physical Exam Vitals and nursing note reviewed.  Constitutional:      General: She is not in acute distress.    Appearance: Normal appearance. She is well-groomed. She is morbidly obese. She is not ill-appearing.  HENT:     Head: Normocephalic and atraumatic.  Eyes:     General: Lids are normal.        Right eye: No discharge.        Left eye: No discharge.     Extraocular Movements: Extraocular movements intact.     Conjunctiva/sclera: Conjunctivae normal.     Right eye: Right conjunctiva is not injected.     Left eye: Left  conjunctiva is not injected.  Neck:     Trachea: Trachea and phonation normal.  Cardiovascular:     Rate and Rhythm: Normal rate and regular rhythm.     Pulses: Normal pulses.     Heart sounds: Normal heart sounds. No murmur heard.   No friction rub. No gallop.  Pulmonary:     Effort: Pulmonary effort is normal. No accessory muscle usage, prolonged expiration or respiratory distress.     Breath sounds: Normal breath sounds. No stridor, decreased air movement or transmitted upper airway sounds. No decreased breath sounds, wheezing, rhonchi or rales.  Chest:     Chest wall: No tenderness.  Musculoskeletal:        General: Normal range of motion.     Cervical back: Normal range of motion and neck supple. Normal range of motion.       Feet:  Feet:     Comments: Small puncture wound appreciated on the plantar aspect of the ball of her right foot with surrounding induration and erythema, tender to palpation. Lymphadenopathy:     Cervical: No cervical adenopathy.  Skin:    General: Skin is warm and dry.     Findings: No erythema or rash.  Neurological:     General: No focal deficit present.     Mental Status: She is alert and oriented to person, place, and time.  Psychiatric:        Mood and Affect: Mood normal.        Behavior: Behavior normal. Behavior is cooperative.    Visual Acuity Right Eye Distance:   Left Eye Distance:   Bilateral Distance:    Right Eye Near:   Left Eye Near:    Bilateral Near:     UC Couse / Diagnostics / Procedures:    EKG  Radiology DG Foot Complete Right  Result Date: 11/14/2021 CLINICAL DATA:  Puncture wound to the bottom of the right foot EXAM: RIGHT FOOT COMPLETE - 3+ VIEW COMPARISON:  None. FINDINGS: No acute fracture or dislocation. No aggressive osseous lesion. Normal alignment. Tiny plantar calcaneal spur. Soft tissue swelling along the dorsal aspect of the foot. No radiopaque foreign body or soft tissue emphysema. IMPRESSION: 1. No  acute  osseous injury of the right foot. Electronically Signed   By: Elige Ko M.D.   On: 11/14/2021 17:40    Procedures Procedures (including critical care time)  UC Diagnoses / Final Clinical Impressions(s)   I have reviewed the triage vital signs and the nursing notes.  Pertinent labs & imaging results that were available during my care of the patient were reviewed by me and considered in my medical decision making (see chart for details).    Final diagnoses:  Infected puncture wound of plantar aspect of foot, right, initial encounter   Patient reassured that no foreign body was seen in her foot on x-ray at any of the 3 views performed.  Patient was placed on fluoroquinolone for possible deep wound infection with P. aeruginosa.  Patient provided with Tdap booster as well.  Return precautions advised.  ED Prescriptions     Medication Sig Dispense Auth. Provider   levofloxacin (LEVAQUIN) 750 MG tablet Take 1 tablet (750 mg total) by mouth daily for 7 days. 7 tablet Theadora Rama Scales, PA-C      PDMP not reviewed this encounter.  Pending results:  Labs Reviewed - No data to display  Medications Ordered in UC: Medications  Tdap (BOOSTRIX) injection 0.5 mL (0.5 mLs Intramuscular Given 11/14/21 1752)    Disposition Upon Discharge:  Condition: stable for discharge home Home: take medications as prescribed; routine discharge instructions as discussed; follow up as advised.  Patient presented with an acute illness with associated systemic symptoms and significant discomfort requiring urgent management. In my opinion, this is a condition that a prudent lay person (someone who possesses an average knowledge of health and medicine) may potentially expect to result in complications if not addressed urgently such as respiratory distress, impairment of bodily function or dysfunction of bodily organs.   Routine symptom specific, illness specific and/or disease specific instructions were  discussed with the patient and/or caregiver at length.   As such, the patient has been evaluated and assessed, work-up was performed and treatment was provided in alignment with urgent care protocols and evidence based medicine.  Patient/parent/caregiver has been advised that the patient may require follow up for further testing and treatment if the symptoms continue in spite of treatment, as clinically indicated and appropriate.  If the patient was tested for COVID-19, Influenza and/or RSV, then the patient/parent/guardian was advised to isolate at home pending the results of his/her diagnostic coronavirus test and potentially longer if theyre positive. I have also advised pt that if his/her COVID-19 test returns positive, it's recommended to self-isolate for at least 10 days after symptoms first appeared AND until fever-free for 24 hours without fever reducer AND other symptoms have improved or resolved. Discussed self-isolation recommendations as well as instructions for household member/close contacts as per the [facility] and [place] [facility], and also gave patient the COVID packet with this information.  Patient/parent/caregiver has been advised to return to the [facility] or PCP in 3-5 days if no better; to PCP or the Emergency Department if new signs and symptoms develop, or if the current signs or symptoms continue to change or worsen for further workup, evaluation and treatment as clinically indicated and appropriate  The patient will follow up with their current PCP if and as advised. If the patient does not currently have a PCP we will assist them in obtaining one.   The patient may need specialty follow up if the symptoms continue, in spite of conservative treatment and management, for further workup, evaluation,  consultation and treatment as clinically indicated and appropriate.   Patient/parent/caregiver verbalized understanding and agreement of plan as discussed.  All questions were addressed during visit.   Please see discharge instructions below for further details of plan.  Discharge Instructions:   Discharge Instructions      Thankfully, there was no foreign body seen in your foot on x-ray.  I believe that the swelling that you feel is due to possible infection and therefore recommend that you begin taking an antibiotic for the next 7 days.  I have prescribed levofloxacin, please take 1 tablet daily for the full 7-day course.  If you have not had improvement of your swelling and pain after 7 days of antibiotics, I recommend that you return for repeat evaluation.      This office note has been dictated using Teaching laboratory technician.  Unfortunately, and despite my best efforts, this method of dictation can sometimes lead to occasional typographical or grammatical errors.  I apologize in advance if this occurs.     Theadora Rama Scales, [place] 11/15/21 [phone]

## 2023-07-05 ENCOUNTER — Encounter: Payer: Self-pay | Admitting: Oncology

## 2023-07-20 IMAGING — DX DG FOOT COMPLETE 3+V*R*
3 series · 3 of 3 positions shown · non-contrast
Comparison: None.

CLINICAL DATA: Puncture wound to the bottom of the right foot

EXAM:
RIGHT FOOT COMPLETE - 3+ VIEW

[foot ap]
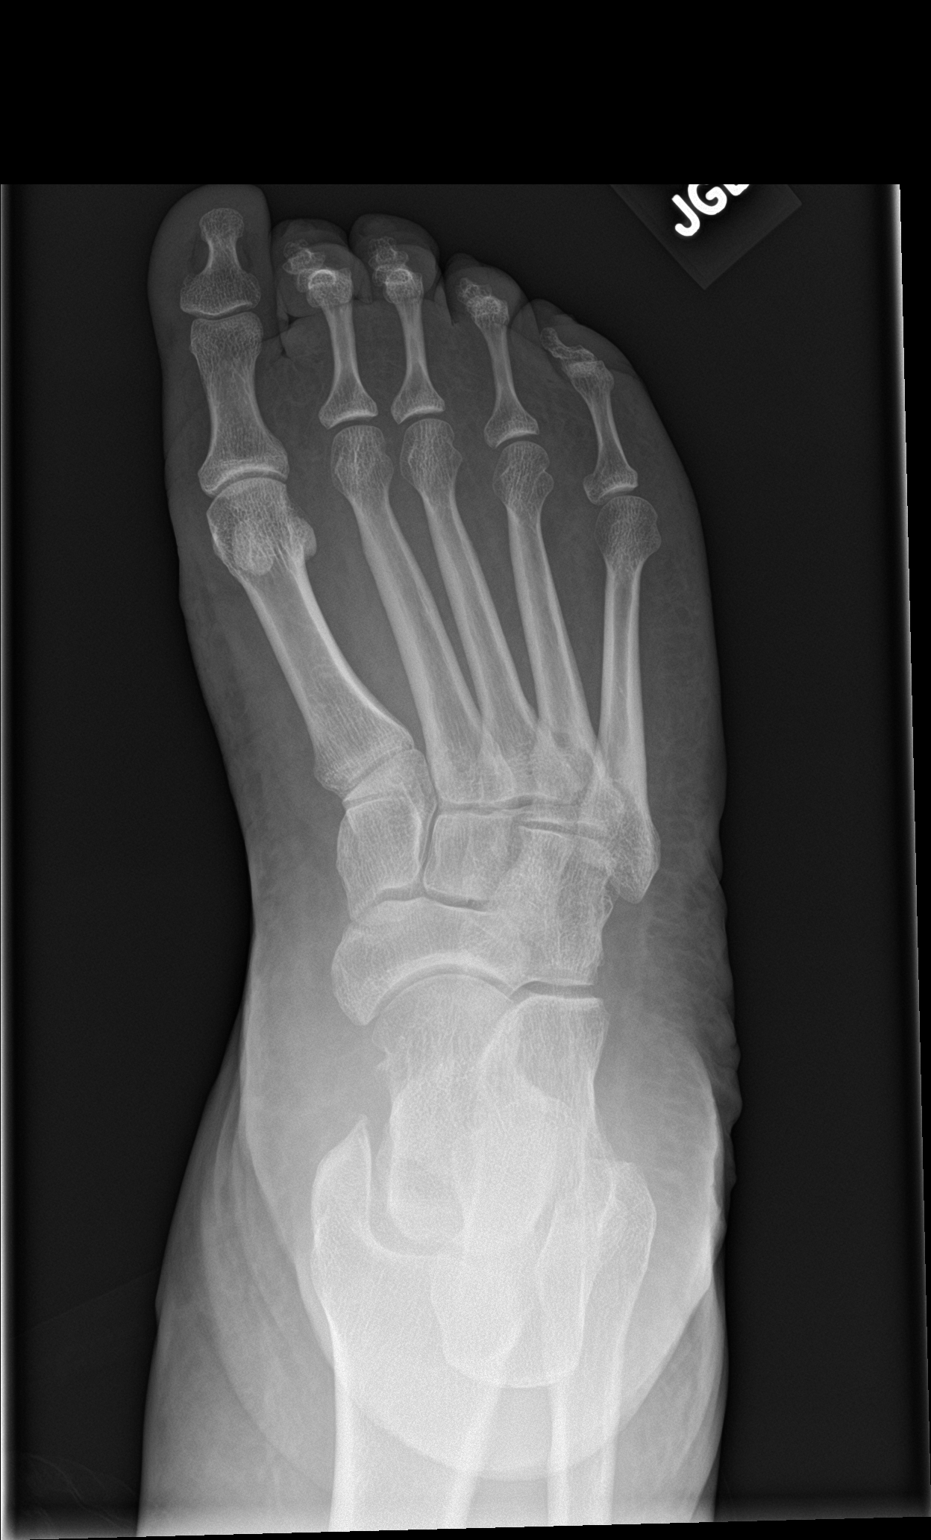

[foot obl]
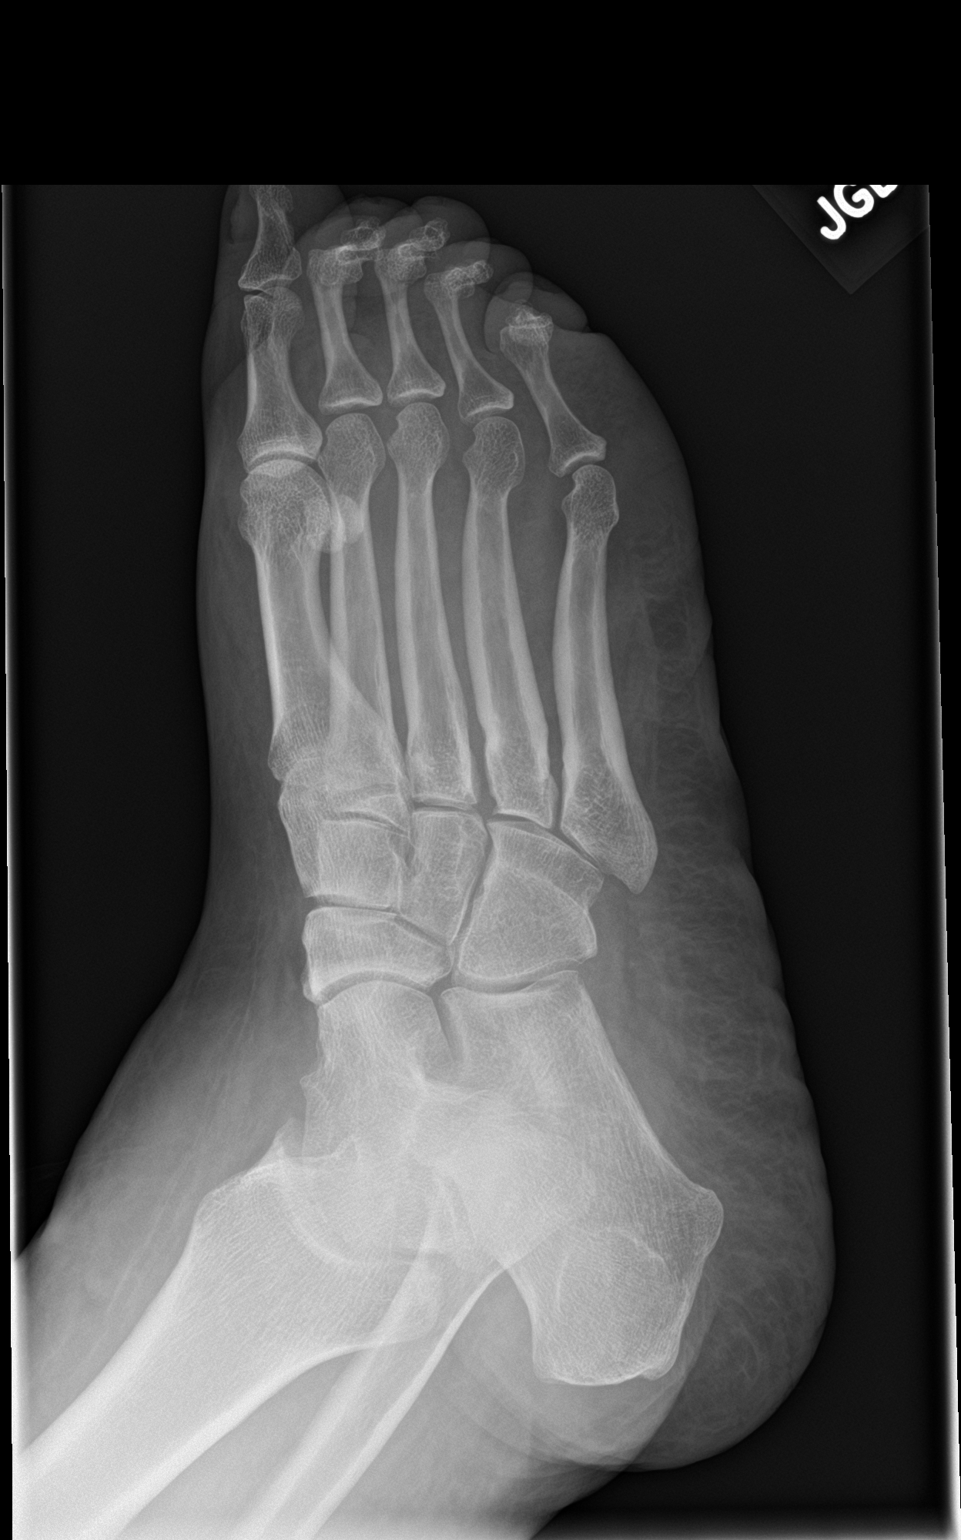

[foot lat]
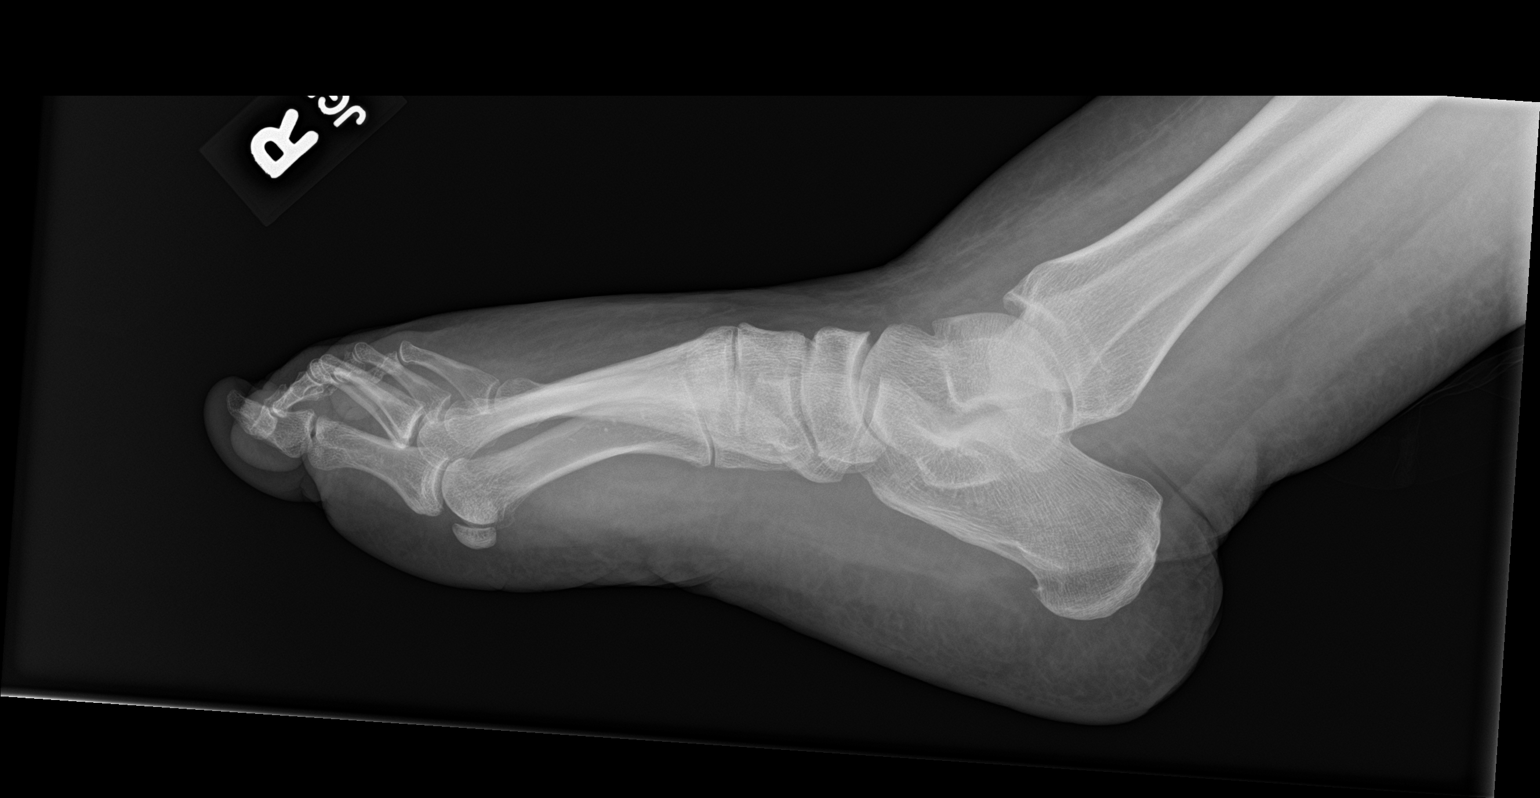

[3 of 3 positions shown; findings below may reference images not displayed]

FINDINGS: No acute fracture or dislocation. No aggressive osseous lesion.
Normal alignment. Tiny plantar calcaneal spur.

Soft tissue swelling along the dorsal aspect of the foot. No
radiopaque foreign body or soft tissue emphysema.
IMPRESSION: 1. No acute osseous injury of the right foot.
